# Patient Record
Sex: Female | Born: 1940
Health system: Southern US, Community
[De-identification: ages and names within clinical notes are randomized; demographics above are authoritative.]

## PROBLEM LIST (undated history)

## (undated) DIAGNOSIS — E785 Hyperlipidemia, unspecified: Secondary | ICD-10-CM

## (undated) DIAGNOSIS — K829 Disease of gallbladder, unspecified: Secondary | ICD-10-CM

## (undated) DIAGNOSIS — F419 Anxiety disorder, unspecified: Secondary | ICD-10-CM

## (undated) DIAGNOSIS — K5790 Diverticulosis of intestine, part unspecified, without perforation or abscess without bleeding: Secondary | ICD-10-CM

## (undated) DIAGNOSIS — B269 Mumps without complication: Secondary | ICD-10-CM

## (undated) DIAGNOSIS — M1990 Unspecified osteoarthritis, unspecified site: Secondary | ICD-10-CM

## (undated) DIAGNOSIS — Z9889 Other specified postprocedural states: Secondary | ICD-10-CM

## (undated) DIAGNOSIS — K7689 Other specified diseases of liver: Secondary | ICD-10-CM

## (undated) DIAGNOSIS — Z8619 Personal history of other infectious and parasitic diseases: Secondary | ICD-10-CM

## (undated) DIAGNOSIS — N281 Cyst of kidney, acquired: Secondary | ICD-10-CM

## (undated) DIAGNOSIS — R112 Nausea with vomiting, unspecified: Secondary | ICD-10-CM

## (undated) DIAGNOSIS — K635 Polyp of colon: Secondary | ICD-10-CM

## (undated) DIAGNOSIS — B059 Measles without complication: Secondary | ICD-10-CM

## (undated) DIAGNOSIS — S82852A Displaced trimalleolar fracture of left lower leg, initial encounter for closed fracture: Secondary | ICD-10-CM

## (undated) DIAGNOSIS — M75100 Unspecified rotator cuff tear or rupture of unspecified shoulder, not specified as traumatic: Secondary | ICD-10-CM

## (undated) DIAGNOSIS — B029 Zoster without complications: Secondary | ICD-10-CM

## (undated) DIAGNOSIS — K219 Gastro-esophageal reflux disease without esophagitis: Secondary | ICD-10-CM

## (undated) DIAGNOSIS — E039 Hypothyroidism, unspecified: Secondary | ICD-10-CM

## (undated) HISTORY — DX: Polyp of colon: K63.5

## (undated) HISTORY — DX: Hypothyroidism, unspecified: E03.9

## (undated) HISTORY — PX: TONSILLECTOMY: SUR1361

## (undated) HISTORY — DX: Hyperlipidemia, unspecified: E78.5

## (undated) HISTORY — DX: Diverticulosis of intestine, part unspecified, without perforation or abscess without bleeding: K57.90

## (undated) HISTORY — DX: Other specified diseases of liver: K76.89

## (undated) HISTORY — DX: Cyst of kidney, acquired: N28.1

## (undated) HISTORY — DX: Disease of gallbladder, unspecified: K82.9

## (undated) HISTORY — PX: TONSILLECTOMY: SHX5217

## (undated) HISTORY — DX: Gastro-esophageal reflux disease without esophagitis: K21.9

---

## 1997-11-30 ENCOUNTER — Ambulatory Visit (HOSPITAL_COMMUNITY): Admission: RE | Admit: 1997-11-30 | Discharge: 1997-11-30 | Payer: Self-pay | Admitting: Obstetrics & Gynecology

## 1998-12-04 ENCOUNTER — Encounter: Payer: Self-pay | Admitting: Obstetrics & Gynecology

## 1998-12-04 ENCOUNTER — Ambulatory Visit (HOSPITAL_COMMUNITY): Admission: RE | Admit: 1998-12-04 | Discharge: 1998-12-04 | Payer: Self-pay | Admitting: Obstetrics & Gynecology

## 1999-05-01 ENCOUNTER — Other Ambulatory Visit: Admission: RE | Admit: 1999-05-01 | Discharge: 1999-05-01 | Payer: Self-pay | Admitting: Obstetrics & Gynecology

## 1999-05-06 HISTORY — PX: ANTERIOR CRUCIATE LIGAMENT REPAIR: SHX115

## 1999-08-29 ENCOUNTER — Ambulatory Visit (HOSPITAL_COMMUNITY): Admission: RE | Admit: 1999-08-29 | Discharge: 1999-08-29 | Payer: Self-pay | Admitting: Specialist

## 1999-08-29 ENCOUNTER — Encounter: Payer: Self-pay | Admitting: Specialist

## 1999-09-20 ENCOUNTER — Encounter: Payer: Self-pay | Admitting: Specialist

## 1999-09-20 ENCOUNTER — Ambulatory Visit (HOSPITAL_COMMUNITY): Admission: RE | Admit: 1999-09-20 | Discharge: 1999-09-20 | Payer: Self-pay | Admitting: Specialist

## 1999-12-06 ENCOUNTER — Encounter: Payer: Self-pay | Admitting: Obstetrics & Gynecology

## 1999-12-06 ENCOUNTER — Ambulatory Visit (HOSPITAL_COMMUNITY): Admission: RE | Admit: 1999-12-06 | Discharge: 1999-12-06 | Payer: Self-pay | Admitting: Obstetrics & Gynecology

## 2000-06-02 ENCOUNTER — Other Ambulatory Visit: Admission: RE | Admit: 2000-06-02 | Discharge: 2000-06-02 | Payer: Self-pay | Admitting: Obstetrics & Gynecology

## 2000-12-21 ENCOUNTER — Ambulatory Visit (HOSPITAL_COMMUNITY): Admission: RE | Admit: 2000-12-21 | Discharge: 2000-12-21 | Payer: Self-pay | Admitting: Obstetrics & Gynecology

## 2000-12-21 ENCOUNTER — Encounter: Payer: Self-pay | Admitting: Obstetrics & Gynecology

## 2001-08-09 ENCOUNTER — Other Ambulatory Visit: Admission: RE | Admit: 2001-08-09 | Discharge: 2001-08-09 | Payer: Self-pay | Admitting: Obstetrics & Gynecology

## 2001-12-31 ENCOUNTER — Ambulatory Visit (HOSPITAL_COMMUNITY): Admission: RE | Admit: 2001-12-31 | Discharge: 2001-12-31 | Payer: Self-pay | Admitting: Obstetrics & Gynecology

## 2001-12-31 ENCOUNTER — Encounter: Payer: Self-pay | Admitting: Obstetrics & Gynecology

## 2002-04-29 ENCOUNTER — Ambulatory Visit (HOSPITAL_COMMUNITY): Admission: AD | Admit: 2002-04-29 | Discharge: 2002-04-29 | Payer: Self-pay | Admitting: Family Medicine

## 2002-04-29 ENCOUNTER — Encounter: Payer: Self-pay | Admitting: Family Medicine

## 2002-10-10 ENCOUNTER — Other Ambulatory Visit: Admission: RE | Admit: 2002-10-10 | Discharge: 2002-10-10 | Payer: Self-pay | Admitting: Obstetrics & Gynecology

## 2003-02-02 ENCOUNTER — Encounter: Payer: Self-pay | Admitting: Obstetrics & Gynecology

## 2003-02-02 ENCOUNTER — Ambulatory Visit (HOSPITAL_COMMUNITY): Admission: RE | Admit: 2003-02-02 | Discharge: 2003-02-02 | Payer: Self-pay | Admitting: Obstetrics & Gynecology

## 2003-10-30 ENCOUNTER — Other Ambulatory Visit: Admission: RE | Admit: 2003-10-30 | Discharge: 2003-10-30 | Payer: Self-pay | Admitting: Obstetrics & Gynecology

## 2004-02-19 ENCOUNTER — Ambulatory Visit (HOSPITAL_COMMUNITY): Admission: RE | Admit: 2004-02-19 | Discharge: 2004-02-19 | Payer: Self-pay | Admitting: Obstetrics & Gynecology

## 2005-02-25 ENCOUNTER — Ambulatory Visit (HOSPITAL_COMMUNITY): Admission: RE | Admit: 2005-02-25 | Discharge: 2005-02-25 | Payer: Self-pay | Admitting: Obstetrics & Gynecology

## 2005-03-25 ENCOUNTER — Ambulatory Visit (HOSPITAL_COMMUNITY): Admission: RE | Admit: 2005-03-25 | Discharge: 2005-03-25 | Payer: Self-pay | Admitting: Emergency Medicine

## 2005-05-23 ENCOUNTER — Other Ambulatory Visit: Admission: RE | Admit: 2005-05-23 | Discharge: 2005-05-23 | Payer: Self-pay | Admitting: Obstetrics & Gynecology

## 2006-02-26 ENCOUNTER — Ambulatory Visit (HOSPITAL_COMMUNITY): Admission: RE | Admit: 2006-02-26 | Discharge: 2006-02-26 | Payer: Self-pay | Admitting: Obstetrics & Gynecology

## 2006-03-31 ENCOUNTER — Ambulatory Visit (HOSPITAL_COMMUNITY): Admission: RE | Admit: 2006-03-31 | Discharge: 2006-03-31 | Payer: Self-pay | Admitting: Gastroenterology

## 2006-03-31 ENCOUNTER — Encounter (INDEPENDENT_AMBULATORY_CARE_PROVIDER_SITE_OTHER): Payer: Self-pay | Admitting: *Deleted

## 2006-08-04 ENCOUNTER — Ambulatory Visit (HOSPITAL_COMMUNITY): Admission: RE | Admit: 2006-08-04 | Discharge: 2006-08-04 | Payer: Self-pay | Admitting: Obstetrics & Gynecology

## 2007-03-04 ENCOUNTER — Ambulatory Visit (HOSPITAL_COMMUNITY): Admission: RE | Admit: 2007-03-04 | Discharge: 2007-03-04 | Payer: Self-pay | Admitting: Obstetrics & Gynecology

## 2007-05-06 HISTORY — PX: ANKLE FRACTURE SURGERY: SHX122

## 2007-05-25 ENCOUNTER — Ambulatory Visit (HOSPITAL_BASED_OUTPATIENT_CLINIC_OR_DEPARTMENT_OTHER): Admission: RE | Admit: 2007-05-25 | Discharge: 2007-05-26 | Payer: Self-pay | Admitting: Orthopedic Surgery

## 2007-07-13 ENCOUNTER — Encounter: Admission: RE | Admit: 2007-07-13 | Discharge: 2007-08-16 | Payer: Self-pay | Admitting: Orthopedic Surgery

## 2007-08-02 ENCOUNTER — Inpatient Hospital Stay (HOSPITAL_COMMUNITY): Admission: EM | Admit: 2007-08-02 | Discharge: 2007-08-04 | Payer: Self-pay | Admitting: Emergency Medicine

## 2008-01-27 ENCOUNTER — Emergency Department (HOSPITAL_COMMUNITY): Admission: EM | Admit: 2008-01-27 | Discharge: 2008-01-27 | Payer: Self-pay | Admitting: Emergency Medicine

## 2008-01-28 ENCOUNTER — Encounter (INDEPENDENT_AMBULATORY_CARE_PROVIDER_SITE_OTHER): Payer: Self-pay | Admitting: Surgery

## 2008-01-28 ENCOUNTER — Ambulatory Visit (HOSPITAL_COMMUNITY): Admission: EM | Admit: 2008-01-28 | Discharge: 2008-01-29 | Payer: Self-pay | Admitting: Emergency Medicine

## 2008-01-28 ENCOUNTER — Encounter: Payer: Self-pay | Admitting: Family Medicine

## 2008-03-29 ENCOUNTER — Ambulatory Visit (HOSPITAL_COMMUNITY): Admission: RE | Admit: 2008-03-29 | Discharge: 2008-03-29 | Payer: Self-pay | Admitting: Obstetrics & Gynecology

## 2008-05-05 HISTORY — PX: APPENDECTOMY: SHX54

## 2009-04-02 ENCOUNTER — Ambulatory Visit (HOSPITAL_COMMUNITY): Admission: RE | Admit: 2009-04-02 | Discharge: 2009-04-02 | Payer: Self-pay | Admitting: Obstetrics & Gynecology

## 2009-05-03 ENCOUNTER — Ambulatory Visit (HOSPITAL_COMMUNITY): Admission: RE | Admit: 2009-05-03 | Discharge: 2009-05-03 | Payer: Self-pay | Admitting: Gastroenterology

## 2010-03-07 ENCOUNTER — Ambulatory Visit (HOSPITAL_COMMUNITY): Admission: RE | Admit: 2010-03-07 | Discharge: 2010-03-07 | Payer: Self-pay | Admitting: Family Medicine

## 2010-04-15 ENCOUNTER — Ambulatory Visit (HOSPITAL_COMMUNITY)
Admission: RE | Admit: 2010-04-15 | Discharge: 2010-04-15 | Payer: Self-pay | Source: Home / Self Care | Attending: Obstetrics & Gynecology | Admitting: Obstetrics & Gynecology

## 2010-05-26 ENCOUNTER — Encounter: Payer: Self-pay | Admitting: Obstetrics & Gynecology

## 2010-09-17 NOTE — H&P (Signed)
NAME:  Kimberly Johns, Kimberly Johns NO.:  [id number]   MEDICAL RECORD NO.:  [id number]          PATIENT TYPE:  INP   LOCATION:  1524                         FACILITY:  [facility]   PHYSICIAN:  Sandria Bales. Ezzard Standing, M.D.  DATE OF BIRTH:  July 28, 1940   DATE OF ADMISSION:  01/28/2008  DATE OF DISCHARGE:                              HISTORY & PHYSICAL   HISTORY OF PRESENT ILLNESS:  This is a 70 year old white female who  works as a Dentist at [employer], is a patient of Carolin Coy at [facility] developed abdominal pain  earlier in the morning on the January 26, 2008.   She apparently went to the [facility] and was thought  to have some kind of possible gastrointestinal bug, was given Cipro and  metron but did not get any improvement.  Today, she saw Dr. Gerri Spore,  who obtained a CT scan at [facility].   The CT scan according to the patient was reviewed by Dr. Myles Rosenthal, who  told she had appendicitis.  She was then sent to [facility]  Room.   She has no history of peptic ulcer disease, liver disease, pancreatic  disease, colon disease.  She has had no prior abdominal surgery or  chronic GI complaints.   PAST MEDICAL HISTORY:  She is allergic to VANCOMYCIN, which gives her  red man syndrome.  She had that in January 2009.   CURRENT MEDICATIONS:  1. Aspirin 81 mg daily.  2. Premarin 0.675 mg daily.  3. Provera 0.5 mg daily.   REVIEW OF SYSTEMS:  NEUROLOGIC:  No seizures or loss of consciousness.  PULMONARY:  Does not smoke cigarettes.  No history of pneumonia or  tuberculosis.  CARDIAC:  He has had no heart disease, chest pain, or hypertension.  She  has never seen a cardiologist.  GASTROINTESTINAL:  See history of present illness.  UROLOGIC:  No kidney stones or kidney infections.  GYN:  She has never  been pregnant or had children.  She is married and her husband is  diabetic.  He is going home but he  is going to come back, I think, for  the surgery.   SOCIAL HISTORY:  She works as a Designer, industrial/product at [employer].   PHYSICAL EXAMINATION:  VITALS:  Her temperature is 98, pulse 93,  respirations 18, blood pressure 110/56.  HEENT:  Unremarkable.  NECK:  Supple.  No masses or thyromegaly.  LUNGS:  Clear to auscultation with symmetric breath sounds.  HEART:  Regular rate and rhythm.  I hear no murmur or rub.  BREASTS:  I did not examine her breast, but she states that she had  negative mammograms.  ABDOMEN:  Soft.  She has active bowel sounds, but she has some mild  guarding and tenderness in her suprapubic and right lower quadrant area.  I felt no mass or hernia.  EXTREMITIES:  She has good strength in all 4 extremities.  NEUROLOGIC:  Grossly intact.   LABORATORY DATA:  The only labs I have back is awhite blood count  of  9200.   I then reviewed her CT scan, which shows thickened appendix going down  to the right pelvis consistent with acute appendicitis.   DIAGNOSIS:  Acute appendicitis.   I discussed with the patient laparoscopic surgery to remove the appendix  and the benefits and risks.  Risk include bleeding, the need for open  surgery, possibility and need for bowel resection and the  hospitalization depending on the severity of appendicitis.      Sandria Bales. Ezzard Standing, M.D.  Electronically Signed     DHN/MEDQ  D:  01/28/2008  T:  01/29/2008  Job:  [id number]   cc:   Otilio Connors. Gerri Spore, M.D.

## 2010-09-17 NOTE — Op Note (Signed)
NAME:  Kimberly Johns, GOLLA NO.:  [id number]   MEDICAL RECORD NO.:  [id number]          PATIENT TYPE:  AMB   LOCATION:  DSC                          FACILITY:  [facility]   PHYSICIAN:  Robert A. Thurston Hole, M.D. DATE OF BIRTH:  02-26-1941   DATE OF PROCEDURE:  05/25/2007  DATE OF DISCHARGE:                               OPERATIVE REPORT   PREOPERATIVE DIAGNOSIS:  Left ankle trimalleolar fracture.   POSTOPERATIVE DIAGNOSIS:  Left ankle trimalleolar fracture.   PROCEDURE:  Open reduction/internal fixation of left ankle trimalleolar  fracture.   SURGEON:  Elana Alm. Thurston Hole, M.D.   ASSISTANT:  Julien Girt, P.A.   ANESTHESIA:  General.   OPERATIVE TIME:  One hour.   TOURNIQUET TIME:  Forty-five minutes.   COMPLICATIONS:  None.   INDICATIONS FOR PROCEDURE:  Ms. Kimberly Johns is a 70 year old woman who  sustained an ankle fracture/[place].  Underwent provisional reduction in the emergency room in [place] and  came to see [place] yesterday, where x-rays and examination were consistent  with a displaced trimalleolar ankle fracture/subluxation.  She is now to  undergo ORIF of this.   DESCRIPTION:  Kimberly Johns was brought to the operating room on May 25, 2007 after a popliteal block was placed in the holding room by  anesthesia.  She received Ancef 1 gm IV preoperatively for prophylaxis.  She was placed on the operative table in a supine position.  After being  placed under general anesthesia, her left foot and leg was prepped using  sterile Betadine and draped using a sterile technique.  The foot and leg  was exsanguinated, and a calf tourniquet elevated to 275 mm.  Initially,  through a 5 cm longitudinal incision based over the distal fibula,  initial exposure was made.  The in-line subcutaneous tissues were  incised along the skin incision.  The peroneal tendons and sural nerve  carefully retracted while the fracture was exposed.  Hematoma  was  removed from around the fracture site.  The fracture was then reduced in  an anatomic position and held there with a clamp while an anterior to  posterior lag screw, a 4.0 mm screw was placed, provisionally holding  the fracture in a reduced and anatomic position.  A 7-hole one-third  fibular plate was then placed on the lateral fibula with the two most  distal drill holes drilled, measured, and tapped, and the appropriate  length 4.0 mm screws placed, and the three most proximal screw holes  drilled, measured, and tapped, and the appropriate length of 3.5 mm  cortical screws placed.  Intraoperative fluoroscopy confirmed  satisfactory reduction of the fibular fracture and satisfactory position  of the hardware.   At this point, attention was turned to the medial malleolus fracture.  A  3 cm medial incision was made.  Underlying subcutaneous tissues were  incised in line with the skin incision.  The saphenous vein carefully  retracted while the fracture was exposed.  Hematoma was removed from  around the fracture site, and then the fracture was held in a reduced  and anatomic position while two guide pins were placed from the  cannulated 4.0 screw set across the fracture site.  Intraoperative  fluoroscopy confirmed satisfactory position of these pins, satisfactory  reduction of the medial malleolus fracture.  Each of these were then  measured and then over-drilled with a 2.7 mm drill and then two  separated 40 x 4.0 mm cannulated screws were placed, holding the  fracture in a reduced an anatomic position.  Intraoperative fluoroscopy  confirmed satisfactory reduction of the fracture.  The mortis was stable  and anatomic under stress x-rays.  Satisfactory position was noted of  the hardware.   At this point, the wounds were irrigated.  The wounds were then closed  with 2-0 Vicryl, 0 Vicryl, and skin staples.  Sterile dressings were  applied and a short leg splint.  The tourniquet was  released, and the  patient was awakened and taken to the recovery room in stable condition.   Needle and sponge counts were correct x2 at the end of the case.   FOLLOW-UP CARE:  Kimberly Johns will be followed overnight for observation  and IV pain control, discharged tomorrow on Percocet and Robaxin.  See  me back in the office in a week for wound check and followup.      Robert A. Thurston Hole, M.D.  Electronically Signed     RAW/MEDQ  D:  05/25/2007  T:  05/25/2007  Job:  [id number]

## 2010-09-17 NOTE — Op Note (Signed)
NAME:  Kimberly Johns, CRILLY NO.:  [id number]   MEDICAL RECORD NO.:  [id number]          PATIENT TYPE:  INP   LOCATION:  1524                         [facility]ACILITY[facility]   PHYSICIAN:  Sandria Bales. Ezzard Standing, M.D.  DATE OF BIRTH:  Aug 20, 1940   DATE OF PROCEDURE:  01/28/2008  DATE OF DISCHARGE:  01/29/2008                               OPERATIVE REPORT   Date of Surgery ??   PREOPERATIVE DIAGNOSIS:  Appendicitis.   POSTOPERATIVE DIAGNOSIS:  Acute gangrenous purulent appendicitis.   PROCEDURE:  Laparoscopic appendectomy.   SURGEON:  Sandria Bales. Ezzard Standing, M.D.   FIRST ASSISTANT:  None.   ANESTHESIA:  General endotracheal with 50 mL of 0.25% Marcaine.   ESTIMATED BLOOD LOSS:  Minimal.   INDICATIONS FOR PROCEDURE:  Kimberly Johns is a 70 year old white female  patient of Dr. Gerri Spore who has had a 2 day history of increasing  abdominal pain, went to the [facility] urgent care first and was put on  antibiotics but because of worsening symptoms had a CT scan obtained  today which was consistent with acute appendicitis.   I discussed with her the indications and potential complications of  appendiceal surgery to try to do this laparoscopically and the risks  include bleeding, open surgery, the need for bowel resection and the  length of hospitalization dictated by the degree of severity of her  appendicitis.   PROCEDURE IN DETAIL:  The patient was placed in supine position with a  left arm tourniquet out to her side and a Foley catheter in place and  given a gram of cefoxitin at initiation of procedure.  Her abdomen was  prepped with Betadine solution and sterilely draped.  A time-out was  taken to identify the patient and the procedure.   An infraumbilical incision was made with sharp dissection and carried  down to the abdominal cavity.  A zero degree 10 mm laparoscope was  inserted through a 12 mm Hasson trocar.  Three additional trocars were  placed, a 5 mm right upper quadrant and  a 10 mm left lower quadrant and  abdominal exploration carried out.  The right and left lobes of the  liver were unremarkable.  Gall Bladder was unremarkable.  Stomach  unremarkable.  I then directed attention to the lower abdomen and in the  right lower quadrant she had a purulent inflammatory mass.  When I  dissected this out the appendix was sort of stuck along the right pelvic  sidewall.  Maybe some of the fallopian tube was stuck to it.  She had a  completely gangrenous purulent gangrenous appendix.  I dissected the  mesentery of the appendix down to the base of the appendix.  I then used  the vascular load of the EndoGIA-45 endostapler and fired this across  the base of the appendix.  Actually at the very base I thought her  tissues looked reasonably good.  I then placed the appendix in the  EndoCatch bag and delivered it through the umbilicus.   A photo was taken of the appendix to put in the chart.  The abdomen  was  irrigated with about a liter and a half of saline.  I reinspected the  staple line.  There was no bleeding from the mesentery and the staple  line looked good.  I tried to irrigate any kind of purulence that was  left behind.  I then suctioned all this out.  I closed the umbilical  port with a 0 Vicryl suture.  I closed the skin at each port with a 5-0  Vicryl suture and painted each with tincture of benzoin and Steri-  Strips.  The patient tolerated the procedure well.   Sponge and needle count were correct again at the end of the case and  the patient was transported to the recovery room in good condition.      Sandria Bales. Ezzard Standing, M.D.  Electronically Signed     DHN/MEDQ  D:  01/28/2008  T:  01/29/2008  Job:  [id number]   cc:   Otilio Connors. Gerri Spore, M.D.  Fax: [phone]

## 2010-09-20 NOTE — Discharge Summary (Signed)
NAME:  MAR, ZETTLER NO.:  [id number]   MEDICAL RECORD NO.:  [id number]          PATIENT TYPE:  INP   LOCATION:  5021                         FACILITY:  [facility]   PHYSICIAN:  Elana Alm. Thurston Hole, M.D. DATE OF BIRTH:  [birth year]   DATE OF ADMISSION:  08/02/2007  DATE OF DISCHARGE:  08/04/2007                               DISCHARGE SUMMARY   ADMITTING DIAGNOSIS:  Left ankle cellulitis  s/p ORIF left ankle fracture.   HISTORY OF PRESENT ILLNESS:  The patient is a 70 year old white female,  who is 2 months status post ORIF left ankle.  She has developed acute  cellulitis in her left lower extremity and has failed conservative care  on clindamycin, Keflex, Rocephin, and Levaquin.  She was admitted for IV  antibiotics.   PROCEDURES IN-HOUSE:  Placement of a PICC line.   HOSPITAL COURSE:  On postop day 1, the patient was admitted.  She was  placed on IV vancomycin.  It was dosed according to pharmacy with strict  bedrest and elevation.  On postop day 1, she had no white cell count.  Her hemoglobin was stable.  She was afebrile.  Her ankle swelling and  cellulitis was improving.  On hospital day 2, redness had significantly  subsided.  She remained afebrile throughout her hospital course.  Her  PICC line was placed on hospital day 2.  On hospital day 3, she was  discharged to home in stable condition, weightbearing as tolerated, on  IV vancomycin, Premarin, Provera, and low-dose aspirin.  She had been  instructed to elevate.  She will follow up in our office in 1 week for a  lower extremity check.      Kirstin Shepperson, P.A.      Robert A. Thurston Hole, M.D.  Electronically Signed    KS/MEDQ  D:  08/30/2007  T:  08/31/2007  Job:  [id number]

## 2010-10-29 ENCOUNTER — Other Ambulatory Visit (HOSPITAL_COMMUNITY): Payer: Self-pay | Admitting: Family Medicine

## 2010-10-29 DIAGNOSIS — E049 Nontoxic goiter, unspecified: Secondary | ICD-10-CM

## 2010-10-31 ENCOUNTER — Other Ambulatory Visit (HOSPITAL_COMMUNITY): Payer: Self-pay | Admitting: Family Medicine

## 2010-10-31 DIAGNOSIS — R221 Localized swelling, mass and lump, neck: Secondary | ICD-10-CM

## 2010-11-12 ENCOUNTER — Ambulatory Visit (HOSPITAL_COMMUNITY)
Admission: RE | Admit: 2010-11-12 | Discharge: 2010-11-12 | Disposition: A | Discharge status: Home / Self Care | Payer: 59 | Source: Ambulatory Visit | Attending: Family Medicine | Admitting: Family Medicine

## 2010-11-12 DIAGNOSIS — E049 Nontoxic goiter, unspecified: Secondary | ICD-10-CM

## 2010-11-12 DIAGNOSIS — E039 Hypothyroidism, unspecified: Secondary | ICD-10-CM | POA: Insufficient documentation

## 2010-11-19 ENCOUNTER — Ambulatory Visit (HOSPITAL_COMMUNITY): Payer: Commercial Managed Care - PPO

## 2011-01-24 LAB — POCT HEMOGLOBIN-HEMACUE: Hemoglobin: 13.4

## 2011-01-27 LAB — COMPREHENSIVE METABOLIC PANEL
ALT: 22
AST: 21
Albumin: 3.6
Alkaline Phosphatase: 83
BUN: 23
CO2: 28
Calcium: 9.3
Chloride: 102
Creatinine, Ser: 0.74
GFR calc Af Amer: >60
GFR calc non Af Amer: >60
Glucose, Bld: 89
Potassium: 3.7
Sodium: 138
Total Bilirubin: 0.3
Total Protein: 7.2

## 2011-01-27 LAB — APTT: aPTT: 29

## 2011-01-27 LAB — DIFFERENTIAL
Basophils Absolute: 0
Basophils Relative: 1
Eosinophils Absolute: 0.1
Eosinophils Relative: 2
Lymphocytes Relative: 36
Lymphs Abs: 2.3
Monocytes Absolute: 0.6
Monocytes Relative: 10
Neutro Abs: 3.3
Neutrophils Relative %: 52

## 2011-01-27 LAB — C-REACTIVE PROTEIN: CRP: 0.8 — ABNORMAL HIGH (ref <0.6)

## 2011-01-27 LAB — CBC
HCT: 38.5
Hemoglobin: 13.4
MCHC: 34.7
MCV: 94.8
Platelets: 368
RBC: 4.07
RDW: 13.6
WBC: 6.4

## 2011-01-27 LAB — SEDIMENTATION RATE: Sed Rate: 22

## 2011-01-27 LAB — PROTIME-INR
INR: 1
Prothrombin Time: 12.9

## 2011-02-03 LAB — DIFFERENTIAL
Basophils Absolute: 0
Basophils Relative: 0
Eosinophils Absolute: 0
Eosinophils Relative: 0
Lymphocytes Relative: 9 — ABNORMAL LOW
Lymphocytes Relative: 9 — ABNORMAL LOW
Lymphs Abs: 0.8
Lymphs Abs: 1.1
Monocytes Absolute: 0.6
Monocytes Absolute: 0.7
Monocytes Relative: 5
Monocytes Relative: 8
Neutro Abs: 10.3 — ABNORMAL HIGH
Neutro Abs: 7.5
Neutrophils Relative %: 86 — ABNORMAL HIGH

## 2011-02-03 LAB — POCT I-STAT, CHEM 8
BUN: 11
Calcium, Ion: 1.14
Chloride: 106
Creatinine, Ser: 0.9
Glucose, Bld: 126 — ABNORMAL HIGH
HCT: 42
Hemoglobin: 14.3
Potassium: 4.1
Sodium: 137
TCO2: 26

## 2011-02-03 LAB — POCT URINALYSIS DIP (DEVICE)
Bilirubin Urine: NEGATIVE
Glucose, UA: NEGATIVE
Ketones, ur: NEGATIVE
Nitrite: NEGATIVE
Operator id: 239701
Protein, ur: 30 — AB
Specific Gravity, Urine: 1.01
Urobilinogen, UA: 0.2
pH: 6.5

## 2011-02-03 LAB — CBC
HCT: 38.7
HCT: 40.2
Hemoglobin: 14
MCHC: 34.8
MCV: 96.2
MCV: 97.2
Platelets: 202
Platelets: 211
RBC: 4.17
RDW: 14.1
RDW: 14.1
WBC: 12 — ABNORMAL HIGH

## 2011-02-03 LAB — COMPREHENSIVE METABOLIC PANEL
Albumin: 3.4 — ABNORMAL LOW
BUN: 9
Creatinine, Ser: 0.8
Potassium: 3.4 — ABNORMAL LOW
Total Protein: 6.7

## 2011-02-03 LAB — AMYLASE: Amylase: 37

## 2011-03-10 ENCOUNTER — Other Ambulatory Visit (HOSPITAL_COMMUNITY): Payer: Self-pay | Admitting: Obstetrics & Gynecology

## 2011-03-10 DIAGNOSIS — Z1231 Encounter for screening mammogram for malignant neoplasm of breast: Secondary | ICD-10-CM

## 2011-04-22 ENCOUNTER — Ambulatory Visit (HOSPITAL_COMMUNITY)
Admission: RE | Admit: 2011-04-22 | Discharge: 2011-04-22 | Disposition: A | Discharge status: Home / Self Care | Payer: 59 | Source: Ambulatory Visit | Attending: Obstetrics & Gynecology | Admitting: Obstetrics & Gynecology

## 2011-04-22 DIAGNOSIS — Z1231 Encounter for screening mammogram for malignant neoplasm of breast: Secondary | ICD-10-CM | POA: Insufficient documentation

## 2012-05-25 ENCOUNTER — Other Ambulatory Visit (HOSPITAL_COMMUNITY): Payer: Self-pay | Admitting: Family Medicine

## 2012-05-25 DIAGNOSIS — Z0000 Encounter for general adult medical examination without abnormal findings: Secondary | ICD-10-CM

## 2012-05-25 DIAGNOSIS — Z1231 Encounter for screening mammogram for malignant neoplasm of breast: Secondary | ICD-10-CM

## 2012-06-08 ENCOUNTER — Ambulatory Visit (HOSPITAL_COMMUNITY)
Admission: RE | Admit: 2012-06-08 | Discharge: 2012-06-08 | Disposition: A | Discharge status: Home / Self Care | Payer: 59 | Source: Ambulatory Visit | Attending: Family Medicine | Admitting: Family Medicine

## 2012-06-08 DIAGNOSIS — Z1231 Encounter for screening mammogram for malignant neoplasm of breast: Secondary | ICD-10-CM | POA: Insufficient documentation

## 2012-06-11 ENCOUNTER — Other Ambulatory Visit (HOSPITAL_COMMUNITY): Payer: Self-pay | Admitting: Family Medicine

## 2012-06-11 DIAGNOSIS — N951 Menopausal and female climacteric states: Secondary | ICD-10-CM

## 2012-06-16 ENCOUNTER — Other Ambulatory Visit (HOSPITAL_COMMUNITY): Payer: Self-pay | Admitting: Family Medicine

## 2012-06-16 ENCOUNTER — Ambulatory Visit (HOSPITAL_COMMUNITY)
Admission: RE | Admit: 2012-06-16 | Discharge: 2012-06-16 | Disposition: A | Discharge status: Home / Self Care | Payer: 59 | Source: Ambulatory Visit | Attending: Family Medicine | Admitting: Family Medicine

## 2012-06-16 DIAGNOSIS — N951 Menopausal and female climacteric states: Secondary | ICD-10-CM

## 2012-06-16 DIAGNOSIS — Z13820 Encounter for screening for osteoporosis: Secondary | ICD-10-CM | POA: Insufficient documentation

## 2012-06-16 DIAGNOSIS — Z780 Asymptomatic menopausal state: Secondary | ICD-10-CM | POA: Insufficient documentation

## 2012-07-01 ENCOUNTER — Other Ambulatory Visit (HOSPITAL_COMMUNITY): Payer: Self-pay | Admitting: Family Medicine

## 2012-07-01 DIAGNOSIS — R1010 Upper abdominal pain, unspecified: Secondary | ICD-10-CM

## 2012-07-02 ENCOUNTER — Encounter: Payer: Self-pay | Admitting: Gastroenterology

## 2012-07-02 ENCOUNTER — Ambulatory Visit (HOSPITAL_COMMUNITY)
Admission: RE | Admit: 2012-07-02 | Discharge: 2012-07-02 | Disposition: A | Discharge status: Home / Self Care | Payer: 59 | Source: Ambulatory Visit | Attending: Family Medicine | Admitting: Family Medicine

## 2012-07-02 DIAGNOSIS — R1011 Right upper quadrant pain: Secondary | ICD-10-CM | POA: Insufficient documentation

## 2012-07-02 DIAGNOSIS — M549 Dorsalgia, unspecified: Secondary | ICD-10-CM | POA: Insufficient documentation

## 2012-07-02 DIAGNOSIS — R1010 Upper abdominal pain, unspecified: Secondary | ICD-10-CM

## 2012-07-02 DIAGNOSIS — K7689 Other specified diseases of liver: Secondary | ICD-10-CM | POA: Insufficient documentation

## 2012-07-21 ENCOUNTER — Ambulatory Visit (INDEPENDENT_AMBULATORY_CARE_PROVIDER_SITE_OTHER): Payer: 59 | Admitting: Gastroenterology

## 2012-07-21 ENCOUNTER — Encounter: Payer: Self-pay | Admitting: Gastroenterology

## 2012-07-21 VITALS — BP 140/90 | HR 76 | Ht 65.0 in | Wt 153.2 lb

## 2012-07-21 DIAGNOSIS — Z860100 Personal history of colon polyps, unspecified: Secondary | ICD-10-CM | POA: Insufficient documentation

## 2012-07-21 DIAGNOSIS — R1013 Epigastric pain: Secondary | ICD-10-CM

## 2012-07-21 DIAGNOSIS — K219 Gastro-esophageal reflux disease without esophagitis: Secondary | ICD-10-CM | POA: Insufficient documentation

## 2012-07-21 MED ORDER — HYOSCYAMINE SULFATE 0.125 MG SL SUBL: 0.2500 mg | SUBLINGUAL_TABLET | SUBLINGUAL | Status: DC | PRN

## 2012-07-21 NOTE — Assessment & Plan Note (Signed)
Patient has reflux symptoms that are partially improved with omeprazole. With long-standing reflux Barrett's esophagus should be ruled out.  Recommendations #1 patient was instructed to take omeprazole before breakfast rather than dinner #2 upper endoscopy

## 2012-07-21 NOTE — Progress Notes (Signed)
History of Present Illness: Pleasant 72 year old white female referred at the request of Dr. Clyde Canterbury for evaluation of abdominal pain and reflux. For the past year she's been having relatively frequent pyrosis. Since starting omeprazole symptoms have improved although they remain. She denies dysphagia. She's on no gastric irritants including nonsteroidals. She also complains of spontaneous and sometimes postprandial upper abdominal pain. Pain may radiate through to the back. She denies nausea.  Recent ultrasound, which I reviewed, demonstrated gallbladder adenomyomatosis and cholesterolosis, and a small simple hepatic cyst.  Patient has history of colon polyps. An adenomatous polyp was removed in 2007. Followup colonoscopy in 2010 was normal.    Past Medical History  Diagnosis Date  . Diverticulosis   . HLD (hyperlipidemia)   . Hypothyroidism   . GERD (gastroesophageal reflux disease)   . Colon polyps   . Gallbladder disease   . Kidney cysts   . Liver cyst    Past Surgical History  Procedure Laterality Date  . Appendectomy  2010  . Tonsillectomy      age 43  . Ankle fracture surgery Left 2009  . Anterior cruciate ligament repair Left 2001   family history includes Alzheimer's disease (age of onset: 64) in her mother. Current Outpatient Prescriptions  Medication Sig Dispense Refill  . cholecalciferol (VITAMIN D) 400 UNITS TABS Take 400 Units by mouth daily.      Marland Kitchen estrogen, conjugated,-medroxyprogesterone (PREMPRO) 0.3-1.5 MG per tablet Take 1 tablet by mouth daily.      Kandice Hams Chondr-MSM (GLUCOSAMINE-CHONDROITIN-MSM) [phone] MG TABS Take 1 tablet by mouth daily.      Marland Kitchen levothyroxine (SYNTHROID, LEVOTHROID) 88 MCG tablet Take 88 mcg by mouth daily.      . Multiple Vitamins-Minerals (WOMENS MULTIVITAMIN PLUS) TABS Take 1 tablet by mouth daily.      Marland Kitchen omeprazole (PRILOSEC) 40 MG capsule Take 40 mg by mouth daily.      . pravastatin (PRAVACHOL) 40 MG tablet Take 40  mg by mouth daily.      . Psyllium (METAMUCIL) 30.9 % POWD Take by mouth daily.      Marland Kitchen desoximetasone (TOPICORT) 0.05 % cream Apply topically as needed.       No current facility-administered medications for this visit.   Allergies as of 07/21/2012 - Review Complete 07/21/2012  Allergen Reaction Noted  . Vancomycin Rash 05/25/2012    reports that she has quit smoking. Her smoking use included Cigarettes. She smoked 0.00 packs per day. She has never used smokeless tobacco. She reports that she does not drink alcohol or use illicit drugs.     Review of Systems: Pertinent positive and negative review of systems were noted in the above HPI section. All other review of systems were otherwise negative.  Vital signs were reviewed in today's medical record Physical Exam: General: Well developed , well nourished, no acute distress Skin: anicteric Head: Normocephalic and atraumatic Eyes:  sclerae anicteric, EOMI Ears: Normal auditory acuity Mouth: No deformity or lesions Neck: Supple, no masses or thyromegaly Lungs: Clear throughout to auscultation Heart: Regular rate and rhythm; no murmurs, rubs or bruits Abdomen: Soft, non tender and non distended. No masses, hepatosplenomegaly or hernias noted. Normal Bowel sounds Rectal:deferred Musculoskeletal: Symmetrical with no gross deformities  Skin: No lesions on visible extremities Pulses:  Normal pulses noted Extremities: No clubbing, cyanosis, edema or deformities noted Neurological: Alert oriented x 4, grossly nonfocal Cervical Nodes:  No significant cervical adenopathy Inguinal Nodes: No significant inguinal adenopathy Psychological:  Alert and cooperative. Normal  mood and affect

## 2012-07-21 NOTE — Assessment & Plan Note (Signed)
Plan followup colonoscopy 2015 

## 2012-07-21 NOTE — Assessment & Plan Note (Addendum)
Patient has several month history of upper abdominal pain with occasional radiation to the back. Recent ultrasound negative for gallbladder stones and minimal wall thickening. Symptoms could be due to ulcer or nonulcer dyspepsia. Pain from chronic cholecystitis is also a consideration.  Recommendations #1 upper endoscopy #2 hyomax when necessary #3 if the patient is not improved with hyomax and endoscopy is nondiagnostic I will obtain a HIDA scan

## 2012-07-21 NOTE — Patient Instructions (Addendum)
You have been scheduled for an endoscopy with propofol. Please follow written instructions given to you at your visit today. If you use inhalers (even only as needed), please bring them with you on the day of your procedure. 

## 2012-08-06 ENCOUNTER — Encounter: Payer: Self-pay | Admitting: Gastroenterology

## 2012-08-06 ENCOUNTER — Ambulatory Visit (AMBULATORY_SURGERY_CENTER): Payer: 59 | Admitting: Gastroenterology

## 2012-08-06 VITALS — BP 123/74 | HR 61 | Temp 98.8°F | Resp 19 | Ht 65.0 in | Wt 153.0 lb

## 2012-08-06 DIAGNOSIS — K219 Gastro-esophageal reflux disease without esophagitis: Secondary | ICD-10-CM

## 2012-08-06 DIAGNOSIS — R1013 Epigastric pain: Secondary | ICD-10-CM

## 2012-08-06 MED ORDER — SODIUM CHLORIDE 0.9 % IV SOLN: 500.0000 mL | INTRAVENOUS | Status: DC

## 2012-08-06 NOTE — Op Note (Signed)
[facility] [address] [place], [address]   ENDOSCOPY PROCEDURE REPORT  PATIENT: Kimberly, Johns  MR#: [id number] BIRTHDATE: 1940/08/05 , 72  yrs. old GENDER: Female ENDOSCOPIST: Louis Meckel, MD REFERRED BY:  Carolin Coy, M.D. PROCEDURE DATE:  08/06/2012 PROCEDURE:  EGD, diagnostic ASA CLASS:     Class II INDICATIONS:  Epigastric pain. MEDICATIONS: MAC sedation, administered by CRNA and propofol (Diprivan) 100mg  IV TOPICAL ANESTHETIC:  DESCRIPTION OF PROCEDURE: After the risks benefits and alternatives of the procedure were thoroughly explained, informed consent was obtained.  The LB [id number]-H180 [id number] endoscope was introduced through the mouth and advanced to the third portion of the duodenum. Without limitations.  The instrument was slowly withdrawn as the mucosa was fully examined.      The upper, middle and distal third of the esophagus were carefully inspected and no abnormalities were noted.  The z-line was well seen at the GEJ.  The endoscope was pushed into the fundus which was normal including a retroflexed view.  The antrum, gastric body, first and second part of the duodenum were unremarkable. Retroflexed views revealed no abnormalities.     The scope was then withdrawn from the patient and the procedure completed.  COMPLICATIONS: There were no complications. ENDOSCOPIC IMPRESSION: Normal EGD  RECOMMENDATIONS: [hyomax as needed for abdominal pain Continue omeprazole Office visit in one month REPEAT EXAM:  eSigned:  Louis Meckel, MD 08/06/2012 3:39 PM   CC:

## 2012-08-06 NOTE — Progress Notes (Addendum)
Patient did not have preoperative order for IV antibiotic SSI prophylaxis. (G8918)  Patient did not experience any of the following events: a burn prior to discharge; a fall within the facility; wrong site/side/patient/procedure/implant event; or a hospital transfer or hospital admission upon discharge from the facility. (G8907)  

## 2012-08-06 NOTE — Progress Notes (Signed)
Stable to RR 

## 2012-08-06 NOTE — Patient Instructions (Addendum)

## 2012-08-09 ENCOUNTER — Telehealth: Payer: Self-pay | Admitting: *Deleted

## 2012-08-09 NOTE — Telephone Encounter (Signed)
  Follow up Call-  Call back number 08/06/2012  Post procedure Call Back phone  # [phone] hm  Permission to leave phone message Yes     Patient questions:  Do you have a fever, pain , or abdominal swelling? no Pain Score  0 *  Have you tolerated food without any problems? yes  Have you been able to return to your normal activities? yes  Do you have any questions about your discharge instructions: Diet   no Medications  no Follow up visit  no  Do you have questions or concerns about your Care? no  Actions: * If pain score is 4 or above: No action needed, pain <4.

## 2012-09-08 ENCOUNTER — Encounter: Payer: Self-pay | Admitting: Gastroenterology

## 2012-09-08 ENCOUNTER — Ambulatory Visit (INDEPENDENT_AMBULATORY_CARE_PROVIDER_SITE_OTHER): Payer: 59 | Admitting: Gastroenterology

## 2012-09-08 VITALS — BP 126/72 | HR 76 | Ht 65.0 in | Wt 154.0 lb

## 2012-09-08 DIAGNOSIS — R1013 Epigastric pain: Secondary | ICD-10-CM

## 2012-09-08 DIAGNOSIS — K219 Gastro-esophageal reflux disease without esophagitis: Secondary | ICD-10-CM

## 2012-09-08 NOTE — Assessment & Plan Note (Signed)
Symptoms have resolved, presumably related to find use of omeprazole. She was instructed to take hyoscyamine as needed.

## 2012-09-08 NOTE — Progress Notes (Signed)
History of Present Illness:  Mrs. Murtagh has returned following upper endoscopy. Exam is normal. On omeprazole every morning she is symptom-free. She no longer has any episodes of pain radiating to her back. She denies pyrosis. On only one occasion has she taken hyomax  which gave her relief.    Review of Systems: Pertinent positive and negative review of systems were noted in the above HPI section. All other review of systems were otherwise negative.    Current Medications, Allergies, Past Medical History, Past Surgical History, Family History and Social History were reviewed in [employer] electronic medical record  Vital signs were reviewed in today's medical record. Physical Exam: General: Well developed , well nourished, no acute distress

## 2012-09-08 NOTE — Patient Instructions (Addendum)
Follow up as needed

## 2012-09-08 NOTE — Assessment & Plan Note (Addendum)
Symptoms are well controlled with omeprazole taken in the mornings. Plan to continue with the same.

## 2012-12-08 ENCOUNTER — Other Ambulatory Visit: Payer: Self-pay

## 2013-03-10 ENCOUNTER — Other Ambulatory Visit: Payer: Self-pay

## 2013-06-09 ENCOUNTER — Other Ambulatory Visit (HOSPITAL_COMMUNITY): Payer: Self-pay | Admitting: Family Medicine

## 2013-06-09 DIAGNOSIS — Z1231 Encounter for screening mammogram for malignant neoplasm of breast: Secondary | ICD-10-CM

## 2013-06-20 ENCOUNTER — Ambulatory Visit (HOSPITAL_COMMUNITY): Payer: 59

## 2013-06-27 ENCOUNTER — Other Ambulatory Visit (HOSPITAL_COMMUNITY): Payer: Self-pay | Admitting: Family Medicine

## 2013-06-27 ENCOUNTER — Ambulatory Visit (HOSPITAL_COMMUNITY)
Admission: RE | Admit: 2013-06-27 | Discharge: 2013-06-27 | Disposition: A | Discharge status: Home / Self Care | Payer: 59 | Source: Ambulatory Visit | Attending: Family Medicine | Admitting: Family Medicine

## 2013-06-27 DIAGNOSIS — Z1231 Encounter for screening mammogram for malignant neoplasm of breast: Secondary | ICD-10-CM | POA: Insufficient documentation

## 2013-07-08 ENCOUNTER — Other Ambulatory Visit: Payer: Self-pay | Admitting: Gastroenterology

## 2014-01-24 ENCOUNTER — Other Ambulatory Visit (HOSPITAL_COMMUNITY): Payer: Self-pay | Admitting: Sports Medicine

## 2014-01-24 DIAGNOSIS — M545 Low back pain, unspecified: Secondary | ICD-10-CM

## 2014-02-07 ENCOUNTER — Ambulatory Visit (HOSPITAL_COMMUNITY): Payer: 59

## 2014-02-07 ENCOUNTER — Ambulatory Visit (HOSPITAL_COMMUNITY)
Admission: RE | Admit: 2014-02-07 | Discharge: 2014-02-07 | Disposition: A | Discharge status: Home / Self Care | Payer: 59 | Source: Ambulatory Visit | Attending: Sports Medicine | Admitting: Sports Medicine

## 2014-02-07 DIAGNOSIS — M545 Low back pain, unspecified: Secondary | ICD-10-CM

## 2014-02-07 DIAGNOSIS — M79604 Pain in right leg: Secondary | ICD-10-CM | POA: Insufficient documentation

## 2014-02-07 DIAGNOSIS — R200 Anesthesia of skin: Secondary | ICD-10-CM | POA: Insufficient documentation

## 2014-06-06 ENCOUNTER — Other Ambulatory Visit (HOSPITAL_COMMUNITY): Payer: Self-pay | Admitting: Family Medicine

## 2014-06-06 DIAGNOSIS — E2839 Other primary ovarian failure: Secondary | ICD-10-CM

## 2014-06-14 ENCOUNTER — Ambulatory Visit (HOSPITAL_COMMUNITY)
Admission: RE | Admit: 2014-06-14 | Discharge: 2014-06-14 | Disposition: A | Discharge status: Home / Self Care | Payer: 59 | Source: Ambulatory Visit | Attending: Family Medicine | Admitting: Family Medicine

## 2014-06-14 DIAGNOSIS — E2839 Other primary ovarian failure: Secondary | ICD-10-CM | POA: Insufficient documentation

## 2014-07-25 ENCOUNTER — Other Ambulatory Visit (HOSPITAL_COMMUNITY): Payer: Self-pay | Admitting: Family Medicine

## 2014-07-25 DIAGNOSIS — Z1231 Encounter for screening mammogram for malignant neoplasm of breast: Secondary | ICD-10-CM

## 2014-07-27 ENCOUNTER — Ambulatory Visit (HOSPITAL_COMMUNITY)
Admission: RE | Admit: 2014-07-27 | Discharge: 2014-07-27 | Disposition: A | Discharge status: Home / Self Care | Payer: 59 | Source: Ambulatory Visit | Attending: Family Medicine | Admitting: Family Medicine

## 2014-07-27 DIAGNOSIS — Z1231 Encounter for screening mammogram for malignant neoplasm of breast: Secondary | ICD-10-CM | POA: Insufficient documentation

## 2014-10-05 ENCOUNTER — Other Ambulatory Visit: Payer: Self-pay | Admitting: Gastroenterology

## 2014-10-23 ENCOUNTER — Encounter (HOSPITAL_COMMUNITY): Payer: Self-pay | Admitting: *Deleted

## 2014-10-24 ENCOUNTER — Other Ambulatory Visit: Payer: Self-pay | Admitting: Gastroenterology

## 2014-10-30 ENCOUNTER — Ambulatory Visit (HOSPITAL_COMMUNITY): Payer: 59 | Admitting: Certified Registered Nurse Anesthetist

## 2014-10-30 ENCOUNTER — Encounter (HOSPITAL_COMMUNITY): Payer: Self-pay | Admitting: *Deleted

## 2014-10-30 ENCOUNTER — Ambulatory Visit (HOSPITAL_COMMUNITY)
Admission: RE | Admit: 2014-10-30 | Discharge: 2014-10-30 | Disposition: A | Discharge status: Home / Self Care | Payer: 59 | Source: Ambulatory Visit | Attending: Gastroenterology | Admitting: Gastroenterology

## 2014-10-30 ENCOUNTER — Encounter (HOSPITAL_COMMUNITY)
Admission: RE | Disposition: A | Discharge status: Home / Self Care | Payer: Self-pay | Source: Ambulatory Visit | Attending: Gastroenterology

## 2014-10-30 DIAGNOSIS — K5730 Diverticulosis of large intestine without perforation or abscess without bleeding: Secondary | ICD-10-CM | POA: Diagnosis not present

## 2014-10-30 DIAGNOSIS — Z86010 Personal history of colonic polyps: Secondary | ICD-10-CM | POA: Diagnosis not present

## 2014-10-30 DIAGNOSIS — K219 Gastro-esophageal reflux disease without esophagitis: Secondary | ICD-10-CM | POA: Insufficient documentation

## 2014-10-30 DIAGNOSIS — Z79890 Hormone replacement therapy (postmenopausal): Secondary | ICD-10-CM | POA: Diagnosis not present

## 2014-10-30 DIAGNOSIS — Z87891 Personal history of nicotine dependence: Secondary | ICD-10-CM | POA: Insufficient documentation

## 2014-10-30 DIAGNOSIS — E039 Hypothyroidism, unspecified: Secondary | ICD-10-CM | POA: Insufficient documentation

## 2014-10-30 DIAGNOSIS — Z09 Encounter for follow-up examination after completed treatment for conditions other than malignant neoplasm: Secondary | ICD-10-CM | POA: Diagnosis present

## 2014-10-30 HISTORY — PX: COLONOSCOPY WITH PROPOFOL: SHX5780

## 2014-10-30 SURGERY — COLONOSCOPY WITH PROPOFOL
Anesthesia: Monitor Anesthesia Care

## 2014-10-30 MED ORDER — LACTATED RINGERS IV SOLN
INTRAVENOUS | Status: DC
  Administered 2014-10-30: 08:00:00 via INTRAVENOUS

## 2014-10-30 MED ORDER — PROPOFOL 10 MG/ML IV BOLUS
INTRAVENOUS | Status: AC
  Filled 2014-10-30: qty 20

## 2014-10-30 MED ORDER — LIDOCAINE HCL (CARDIAC) 20 MG/ML IV SOLN
INTRAVENOUS | Status: AC
  Filled 2014-10-30: qty 5

## 2014-10-30 MED ORDER — SODIUM CHLORIDE 0.9 % IV SOLN: INTRAVENOUS | Status: DC

## 2014-10-30 MED ORDER — PROPOFOL 500 MG/50ML IV EMUL
INTRAVENOUS | Status: DC | PRN
  Administered 2014-10-30: 75 mg via INTRAVENOUS
  Administered 2014-10-30 (×2): 50 mg via INTRAVENOUS
  Administered 2014-10-30: 25 mg via INTRAVENOUS
  Administered 2014-10-30 (×3): 50 mg via INTRAVENOUS

## 2014-10-30 SURGICAL SUPPLY — 21 items

## 2014-10-30 NOTE — Anesthesia Preprocedure Evaluation (Signed)
Anesthesia Evaluation  Patient identified by MRN, date of birth, ID band Patient awake    Reviewed: Allergy & Precautions, H&P , NPO status , Patient's Chart, lab work & pertinent test results  Airway Mallampati: II  TM Distance: >3 FB Neck ROM: full    Dental  (+) Caps, Dental Advisory Given Veneers and caps front teeth:   Pulmonary neg pulmonary ROS, former smoker,  breath sounds clear to auscultation  Pulmonary exam normal       Cardiovascular Exercise Tolerance: Good negative cardio ROS Normal cardiovascular examRhythm:regular Rate:Normal     Neuro/Psych negative neurological ROS  negative psych ROS   GI/Hepatic negative GI ROS, Neg liver ROS, GERD-  Medicated and Controlled,  Endo/Other  negative endocrine ROSHypothyroidism   Renal/GU negative Renal ROS  negative genitourinary   Musculoskeletal   Abdominal   Peds  Hematology negative hematology ROS (+)   Anesthesia Other Findings   Reproductive/Obstetrics negative OB ROS                             Anesthesia Physical Anesthesia Plan  ASA: II  Anesthesia Plan: MAC   Post-op Pain Management:    Induction:   Airway Management Planned:   Additional Equipment:   Intra-op Plan:   Post-operative Plan:   Informed Consent: I have reviewed the patients History and Physical, chart, labs and discussed the procedure including the risks, benefits and alternatives for the proposed anesthesia with the patient or authorized representative who has indicated his/her understanding and acceptance.   Dental Advisory Given  Plan Discussed with: CRNA and Surgeon  Anesthesia Plan Comments:         Anesthesia Quick Evaluation

## 2014-10-30 NOTE — H&P (Signed)
  Procedure: Surveillance colonoscopy. 05/03/2009 normal surveillance colonoscopy performed. 03/31/2006 colonoscopy performed with removal of a 5 mm adenomatous sigmoid colon polyp  History: The patient is a 74 year old female born May 28, 1940. She is scheduled to undergo a surveillance colonoscopy today.  Past medical history: Back and hip surgery. Tonsillectomy. Left knee surgery. Appendectomy. Left ankle fracture surgery with metal plates. Hormone replacement therapy. Hypothyroidism. Hypercholesterolemia.  Medication allergies: Vancomycin causes rash  Exam: The patient is alert and lying comfortably on the endoscopy stretcher. Abdomen is soft and nontender to palpation. Lungs are clear to auscultation. Cardiac exam reveals a regular rhythm.  Plan: Proceed with surveillance colonoscopy

## 2014-10-30 NOTE — Transfer of Care (Signed)
Immediate Anesthesia Transfer of Care Note  Patient: Kimberly Johns  Procedure(s) Performed: Procedure(s): COLONOSCOPY WITH PROPOFOL (N/A)  Patient Location: PACU  Anesthesia Type:MAC  Level of Consciousness: awake, alert  and oriented  Airway & Oxygen Therapy: Patient Spontanous Breathing and Patient connected to face mask oxygen  Post-op Assessment: Report given to RN and Post -op Vital signs reviewed and stable  Post vital signs: Reviewed and stable  Last Vitals:  Filed Vitals:   10/30/14 0740  BP: 131/65  Pulse: 56  Temp: 36.4 C  Resp: 16    Complications: No apparent anesthesia complications

## 2014-10-30 NOTE — Discharge Instructions (Signed)

## 2014-10-30 NOTE — Op Note (Signed)
Procedure: Surveillance colonoscopy. 03/31/2006 colonoscopy performed with removal of a 5 mm adenomatous sigmoid colon polyp. 05/03/2009 normal surveillance colonoscopy  Endoscopist: Danise Edge  Premedication: Propofol administered by anesthesia  Procedure: The patient was placed in the left lateral decubitus position. Anal inspection and digital rectal exam were normal. The Pentax pediatric colonoscope was introduced into the rectum and advanced to the cecum. A normal-appearing appendiceal orifice and ileocecal valve were identified. Colonic preparation for the exam today was satisfactory. Withdrawal time was 16 minutes  Rectum. Normal. Retroflexed view of the distal rectum was normal  Sigmoid colon and descending colon. Left colonic diverticulosis  Splenic flexure. Normal  Transverse colon. Normal  Hepatic flexure. Normal  Ascending colon. Normal  Cecum and ileocecal valve. Normal  Assessment: Normal surveillance colonoscopy

## 2014-10-30 NOTE — Anesthesia Postprocedure Evaluation (Signed)
  Anesthesia Post-op Note  Patient: Kimberly Johns  Procedure(s) Performed: Procedure(s) (LRB): COLONOSCOPY WITH PROPOFOL (N/A)  Patient Location: PACU  Anesthesia Type: MAC  Level of Consciousness: awake and alert   Airway and Oxygen Therapy: Patient Spontanous Breathing  Post-op Pain: mild  Post-op Assessment: Post-op Vital signs reviewed, Patient's Cardiovascular Status Stable, Respiratory Function Stable, Patent Airway and No signs of Nausea or vomiting  Last Vitals:  Filed Vitals:   10/30/14 1020  BP: 140/88  Pulse: 65  Temp:   Resp: 25    Post-op Vital Signs: stable   Complications: No apparent anesthesia complications

## 2014-10-31 ENCOUNTER — Encounter (HOSPITAL_COMMUNITY): Payer: Self-pay | Admitting: Gastroenterology

## 2014-11-20 ENCOUNTER — Other Ambulatory Visit (HOSPITAL_COMMUNITY): Payer: Self-pay | Admitting: Orthopaedic Surgery

## 2014-12-01 ENCOUNTER — Encounter (HOSPITAL_COMMUNITY): Payer: Self-pay

## 2014-12-01 ENCOUNTER — Encounter (HOSPITAL_COMMUNITY)
Admission: RE | Admit: 2014-12-01 | Discharge: 2014-12-01 | Disposition: A | Discharge status: Home / Self Care | Payer: 59 | Source: Ambulatory Visit | Attending: Orthopaedic Surgery | Admitting: Orthopaedic Surgery

## 2014-12-01 DIAGNOSIS — M1611 Unilateral primary osteoarthritis, right hip: Secondary | ICD-10-CM | POA: Insufficient documentation

## 2014-12-01 DIAGNOSIS — K219 Gastro-esophageal reflux disease without esophagitis: Secondary | ICD-10-CM | POA: Insufficient documentation

## 2014-12-01 DIAGNOSIS — Z01812 Encounter for preprocedural laboratory examination: Secondary | ICD-10-CM | POA: Insufficient documentation

## 2014-12-01 DIAGNOSIS — Z79899 Other long term (current) drug therapy: Secondary | ICD-10-CM | POA: Diagnosis not present

## 2014-12-01 HISTORY — DX: Zoster without complications: B02.9

## 2014-12-01 HISTORY — DX: Unspecified osteoarthritis, unspecified site: M19.90

## 2014-12-01 HISTORY — DX: Personal history of other infectious and parasitic diseases: Z86.19

## 2014-12-01 HISTORY — DX: Measles without complication: B05.9

## 2014-12-01 HISTORY — DX: Displaced trimalleolar fracture of left lower leg, initial encounter for closed fracture: S82.852A

## 2014-12-01 HISTORY — DX: Mumps without complication: B26.9

## 2014-12-01 LAB — CBC
HCT: 36.3 % (ref 36.0–46.0)
Hemoglobin: 12 g/dL (ref 12.0–15.0)
MCH: 32.3 pg (ref 26.0–34.0)
MCHC: 33.1 g/dL (ref 30.0–36.0)
MCV: 97.6 fL (ref 78.0–100.0)
Platelets: 215 10*3/uL (ref 150–400)
RBC: 3.72 MIL/uL — ABNORMAL LOW (ref 3.87–5.11)
RDW: 14.3 % (ref 11.5–15.5)
WBC: 4.7 10*3/uL (ref 4.0–10.5)

## 2014-12-01 LAB — BASIC METABOLIC PANEL
Anion gap: 8 (ref 5–15)
BUN: 19 mg/dL (ref 6–20)
CO2: 26 mmol/L (ref 22–32)
CREATININE: 0.77 mg/dL (ref 0.44–1.00)
Calcium: 9.5 mg/dL (ref 8.9–10.3)
Chloride: 104 mmol/L (ref 101–111)
GFR calc non Af Amer: >60 mL/min (ref >60)
Glucose, Bld: 97 mg/dL (ref 65–99)
POTASSIUM: 4.5 mmol/L (ref 3.5–5.1)
SODIUM: 138 mmol/L (ref 135–145)

## 2014-12-01 LAB — ABO/RH: ABO/RH(D): O POS

## 2014-12-01 LAB — SURGICAL PCR SCREEN
MRSA, PCR: NEGATIVE
Staphylococcus aureus: POSITIVE — AB

## 2014-12-01 LAB — PROTIME-INR
INR: 0.97 (ref 0.00–1.49)
Prothrombin Time: 13.1 seconds (ref 11.6–15.2)

## 2014-12-01 LAB — APTT: APTT: 27 s (ref 24–37)

## 2014-12-01 NOTE — Progress Notes (Addendum)
Surgical screening results in epic per PAT visit 12/01/2014 positive for STAPH. Results sent to Dr Maureen Ralphs. Prescription for Mupriocin Ointment called to [facility] spoke with [place]. Pt is aware.

## 2014-12-01 NOTE — Patient Instructions (Signed)
ETHELDA DEANGELO  12/01/2014   Your procedure is scheduled on: Friday December 08, 2014   Report to [facility]  Entrance take [place]  elevators to 3rd floor to  Short Stay Center at 5:30 AM.  Call this number if you have problems the morning of surgery [phone]   Remember: ONLY 1 PERSON MAY GO WITH YOU TO SHORT STAY TO GET  READY MORNING OF YOUR SURGERY.  Do not eat food or drink liquids :After Midnight.     Take these medicines the morning of surgery with A SIP OF WATER: Levothyroxine; Omeprazole (Prilosec) oxycodone-acetaminophen if needed                               You may not have any metal on your body including hair pins and              piercings  Do not wear jewelry, make-up, lotions, powders or perfumes, deodorant             Do not wear nail polish.  Do not shave  48 hours prior to surgery.               Do not bring valuables to the hospital.  IS NOT             RESPONSIBLE   FOR VALUABLES.  Contacts, dentures or bridgework may not be worn into surgery.  Leave suitcase in the car. After surgery it may be brought to your room.                  Please read over the following fact sheets you were given:MRSA INFORMATION SHEET; BLOOD TRANSFUSION INFORMATION SHEET _____________________________________________________________________             [facility] - Preparing for Surgery Before surgery, you can play an important role.  Because skin is not sterile, your skin needs to be as free of germs as possible.  You can reduce the number of germs on your skin by washing with CHG (chlorahexidine gluconate) soap before surgery.  CHG is an antiseptic cleaner which kills germs and bonds with the skin to continue killing germs even after washing. Please DO NOT use if you have an allergy to CHG or antibacterial soaps.  If your skin becomes reddened/irritated stop using the CHG and inform your nurse when you arrive at Short Stay. Do not shave  (including legs and underarms) for at least 48 hours prior to the first CHG shower.  You may shave your face/neck. Please follow these instructions carefully:  1.  Shower with CHG Soap the night before surgery and the  morning of Surgery.  2.  If you choose to wash your hair, wash your hair first as usual with your  normal  shampoo.  3.  After you shampoo, rinse your hair and body thoroughly to remove the  shampoo.                           4.  Use CHG as you would any other liquid soap.  You can apply chg directly  to the skin and wash                       Gently with a scrungie or clean washcloth.  5.  Apply the CHG Soap to your body ONLY FROM THE NECK DOWN.   Do not use on face/ open                           Wound or open sores. Avoid contact with eyes, ears mouth and genitals (private parts).                       Wash face,  Genitals (private parts) with your normal soap.             6.  Wash thoroughly, paying special attention to the area where your surgery  will be performed.  7.  Thoroughly rinse your body with warm water from the neck down.  8.  DO NOT shower/wash with your normal soap after using and rinsing off  the CHG Soap.                9.  Pat yourself dry with a clean towel.            10.  Wear clean pajamas.            11.  Place clean sheets on your bed the night of your first shower and do not  sleep with pets. Day of Surgery : Do not apply any lotions/deodorants the morning of surgery.  Please wear clean clothes to the hospital/surgery center.  FAILURE TO FOLLOW THESE INSTRUCTIONS MAY RESULT IN THE CANCELLATION OF YOUR SURGERY PATIENT SIGNATURE_________________________________  NURSE SIGNATURE__________________________________  ________________________________________________________________________  WHAT IS A BLOOD TRANSFUSION? Blood Transfusion Information  A transfusion is the replacement of blood or some of its parts. Blood is made up of multiple cells which  provide different functions.  Red blood cells carry oxygen and are used for blood loss replacement.  White blood cells fight against infection.  Platelets control bleeding.  Plasma helps clot blood.  Other blood products are available for specialized needs, such as hemophilia or other clotting disorders. BEFORE THE TRANSFUSION  Who gives blood for transfusions?   Healthy volunteers who are fully evaluated to make sure their blood is safe. This is blood bank blood. Transfusion therapy is the safest it has ever been in the practice of medicine. Before blood is taken from a donor, a complete history is taken to make sure that person has no history of diseases nor engages in risky social behavior (examples are intravenous drug use or sexual activity with multiple partners). The donor's travel history is screened to minimize risk of transmitting infections, such as malaria. The donated blood is tested for signs of infectious diseases, such as HIV and hepatitis. The blood is then tested to be sure it is compatible with you in order to minimize the chance of a transfusion reaction. If you or a relative donates blood, this is often done in anticipation of surgery and is not appropriate for emergency situations. It takes many days to process the donated blood. RISKS AND COMPLICATIONS Although transfusion therapy is very safe and saves many lives, the main dangers of transfusion include:   Getting an infectious disease.  Developing a transfusion reaction. This is an allergic reaction to something in the blood you were given. Every precaution is taken to prevent this. The decision to have a blood transfusion has been considered carefully by your caregiver before blood is given. Blood is not given unless the benefits outweigh the risks. AFTER THE TRANSFUSION  Right after receiving a  blood transfusion, you will usually feel much better and more energetic. This is especially true if your red blood cells  have gotten low (anemic). The transfusion raises the level of the red blood cells which carry oxygen, and this usually causes an energy increase.  The nurse administering the transfusion will monitor you carefully for complications. HOME CARE INSTRUCTIONS  No special instructions are needed after a transfusion. You may find your energy is better. Speak with your caregiver about any limitations on activity for underlying diseases you may have. SEEK MEDICAL CARE IF:   Your condition is not improving after your transfusion.  You develop redness or irritation at the intravenous (IV) site. SEEK IMMEDIATE MEDICAL CARE IF:  Any of the following symptoms occur over the next 12 hours:  Shaking chills.  You have a temperature by mouth above 102 F (38.9 C), not controlled by medicine.  Chest, back, or muscle pain.  People around you feel you are not acting correctly or are confused.  Shortness of breath or difficulty breathing.  Dizziness and fainting.  You get a rash or develop hives.  You have a decrease in urine output.  Your urine turns a dark color or changes to pink, red, or brown. Any of the following symptoms occur over the next 10 days:  You have a temperature by mouth above 102 F (38.9 C), not controlled by medicine.  Shortness of breath.  Weakness after normal activity.  The white part of the eye turns yellow (jaundice).  You have a decrease in the amount of urine or are urinating less often.  Your urine turns a dark color or changes to pink, red, or brown. Document Released: 04/18/2000 Document Revised: 07/14/2011 Document Reviewed: 12/06/2007 [facility] [place] Patient Information 2014 [place], [place].  _______________________________________________________________________

## 2014-12-07 LAB — TYPE AND SCREEN
ABO/RH(D): O POS
Antibody Screen: NEGATIVE

## 2014-12-08 ENCOUNTER — Inpatient Hospital Stay (HOSPITAL_COMMUNITY): Payer: 59

## 2014-12-08 ENCOUNTER — Encounter (HOSPITAL_COMMUNITY)
Admission: RE | Disposition: A | Discharge status: Home Health | Payer: Self-pay | Source: Ambulatory Visit | Attending: Orthopaedic Surgery

## 2014-12-08 ENCOUNTER — Inpatient Hospital Stay (HOSPITAL_COMMUNITY)
Admission: RE | Admit: 2014-12-08 | Discharge: 2014-12-10 | DRG: 470 | Disposition: A | Discharge status: Home Health | Payer: 59 | Source: Ambulatory Visit | Attending: Orthopaedic Surgery | Admitting: Orthopaedic Surgery

## 2014-12-08 ENCOUNTER — Encounter (HOSPITAL_COMMUNITY): Payer: Self-pay | Admitting: *Deleted

## 2014-12-08 ENCOUNTER — Inpatient Hospital Stay (HOSPITAL_COMMUNITY): Payer: 59 | Admitting: Anesthesiology

## 2014-12-08 DIAGNOSIS — Z01812 Encounter for preprocedural laboratory examination: Secondary | ICD-10-CM

## 2014-12-08 DIAGNOSIS — E785 Hyperlipidemia, unspecified: Secondary | ICD-10-CM | POA: Diagnosis present

## 2014-12-08 DIAGNOSIS — Z87891 Personal history of nicotine dependence: Secondary | ICD-10-CM

## 2014-12-08 DIAGNOSIS — M25551 Pain in right hip: Secondary | ICD-10-CM | POA: Diagnosis present

## 2014-12-08 DIAGNOSIS — E039 Hypothyroidism, unspecified: Secondary | ICD-10-CM | POA: Diagnosis present

## 2014-12-08 DIAGNOSIS — K219 Gastro-esophageal reflux disease without esophagitis: Secondary | ICD-10-CM | POA: Diagnosis present

## 2014-12-08 DIAGNOSIS — Z96641 Presence of right artificial hip joint: Secondary | ICD-10-CM

## 2014-12-08 DIAGNOSIS — Z79899 Other long term (current) drug therapy: Secondary | ICD-10-CM

## 2014-12-08 DIAGNOSIS — D62 Acute posthemorrhagic anemia: Secondary | ICD-10-CM | POA: Diagnosis not present

## 2014-12-08 DIAGNOSIS — M1611 Unilateral primary osteoarthritis, right hip: Secondary | ICD-10-CM | POA: Diagnosis present

## 2014-12-08 DIAGNOSIS — Z09 Encounter for follow-up examination after completed treatment for conditions other than malignant neoplasm: Secondary | ICD-10-CM

## 2014-12-08 HISTORY — PX: TOTAL HIP ARTHROPLASTY: SHX124

## 2014-12-08 SURGERY — ARTHROPLASTY, HIP, TOTAL, ANTERIOR APPROACH
Anesthesia: General | Site: Hip | Laterality: Right

## 2014-12-08 MED ORDER — VITAMIN D3 25 MCG (1000 UNIT) PO TABS
5000.0000 [IU] | ORAL_TABLET | Freq: Every day | ORAL | Status: DC
  Administered 2014-12-09 – 2014-12-10 (×2): 5000 [IU] via ORAL
  Filled 2014-12-08 (×4): qty 5

## 2014-12-08 MED ORDER — PROPOFOL 10 MG/ML IV BOLUS
INTRAVENOUS | Status: AC
  Filled 2014-12-08: qty 20

## 2014-12-08 MED ORDER — FENTANYL CITRATE (PF) 100 MCG/2ML IJ SOLN
INTRAMUSCULAR | Status: AC
  Filled 2014-12-08: qty 4

## 2014-12-08 MED ORDER — ACETAMINOPHEN 650 MG RE SUPP: 650.0000 mg | Freq: Four times a day (QID) | RECTAL | Status: DC | PRN

## 2014-12-08 MED ORDER — MIDAZOLAM HCL 2 MG/2ML IJ SOLN
INTRAMUSCULAR | Status: AC
  Filled 2014-12-08: qty 4

## 2014-12-08 MED ORDER — ACETAMINOPHEN 325 MG PO TABS: 650.0000 mg | ORAL_TABLET | Freq: Four times a day (QID) | ORAL | Status: DC | PRN

## 2014-12-08 MED ORDER — CEFAZOLIN SODIUM 1-5 GM-% IV SOLN
1.0000 g | Freq: Four times a day (QID) | INTRAVENOUS | Status: AC
  Administered 2014-12-08 (×2): 1 g via INTRAVENOUS
  Filled 2014-12-08 (×3): qty 50

## 2014-12-08 MED ORDER — PRAVASTATIN SODIUM 40 MG PO TABS
40.0000 mg | ORAL_TABLET | Freq: Every day | ORAL | Status: DC
  Administered 2014-12-08 – 2014-12-09 (×2): 40 mg via ORAL
  Filled 2014-12-08 (×4): qty 1

## 2014-12-08 MED ORDER — CEFAZOLIN SODIUM-DEXTROSE 2-3 GM-% IV SOLR
2.0000 g | INTRAVENOUS | Status: AC
  Administered 2014-12-08: 2 g via INTRAVENOUS

## 2014-12-08 MED ORDER — ONDANSETRON HCL 4 MG/2ML IJ SOLN
INTRAMUSCULAR | Status: DC | PRN
  Administered 2014-12-08: 4 mg via INTRAVENOUS

## 2014-12-08 MED ORDER — PROMETHAZINE HCL 25 MG/ML IJ SOLN: 6.2500 mg | INTRAMUSCULAR | Status: DC | PRN

## 2014-12-08 MED ORDER — ADULT MULTIVITAMIN W/MINERALS CH
1.0000 | ORAL_TABLET | Freq: Every day | ORAL | Status: DC
  Administered 2014-12-09 (×2): 1 via ORAL
  Filled 2014-12-08 (×4): qty 1

## 2014-12-08 MED ORDER — POLYETHYLENE GLYCOL 3350 17 G PO PACK: 17.0000 g | PACK | Freq: Every day | ORAL | Status: DC | PRN

## 2014-12-08 MED ORDER — ZOLPIDEM TARTRATE 5 MG PO TABS: 5.0000 mg | ORAL_TABLET | Freq: Every evening | ORAL | Status: DC | PRN

## 2014-12-08 MED ORDER — ROCURONIUM BROMIDE 100 MG/10ML IV SOLN
INTRAVENOUS | Status: DC | PRN
  Administered 2014-12-08: 40 mg via INTRAVENOUS

## 2014-12-08 MED ORDER — MIDAZOLAM HCL 5 MG/5ML IJ SOLN
INTRAMUSCULAR | Status: DC | PRN
  Administered 2014-12-08: 2 mg via INTRAVENOUS

## 2014-12-08 MED ORDER — DOCUSATE SODIUM 100 MG PO CAPS
100.0000 mg | ORAL_CAPSULE | Freq: Two times a day (BID) | ORAL | Status: DC
  Administered 2014-12-08 – 2014-12-10 (×4): 100 mg via ORAL

## 2014-12-08 MED ORDER — FENTANYL CITRATE (PF) 100 MCG/2ML IJ SOLN
INTRAMUSCULAR | Status: AC
  Filled 2014-12-08: qty 2

## 2014-12-08 MED ORDER — PANTOPRAZOLE SODIUM 40 MG PO TBEC
80.0000 mg | DELAYED_RELEASE_TABLET | Freq: Every day | ORAL | Status: DC
  Administered 2014-12-09 – 2014-12-10 (×2): 80 mg via ORAL
  Filled 2014-12-08 (×2): qty 2

## 2014-12-08 MED ORDER — PROPOFOL 10 MG/ML IV BOLUS
INTRAVENOUS | Status: DC | PRN
  Administered 2014-12-08: 120 mg via INTRAVENOUS

## 2014-12-08 MED ORDER — HYDROMORPHONE HCL 1 MG/ML IJ SOLN
1.0000 mg | INTRAMUSCULAR | Status: DC | PRN
  Administered 2014-12-08 – 2014-12-09 (×4): 1 mg via INTRAVENOUS
  Filled 2014-12-08 (×4): qty 1

## 2014-12-08 MED ORDER — SODIUM CHLORIDE 0.9 % IV SOLN
INTRAVENOUS | Status: DC
  Administered 2014-12-08: 13:00:00 via INTRAVENOUS

## 2014-12-08 MED ORDER — METHOCARBAMOL 500 MG PO TABS
500.0000 mg | ORAL_TABLET | Freq: Four times a day (QID) | ORAL | Status: DC | PRN
Start: 2014-12-08 — End: 2014-12-10
  Administered 2014-12-09 – 2014-12-10 (×4): 500 mg via ORAL
  Filled 2014-12-08 (×4): qty 1

## 2014-12-08 MED ORDER — MENTHOL 3 MG MT LOZG: 1.0000 | LOZENGE | OROMUCOSAL | Status: DC | PRN

## 2014-12-08 MED ORDER — ONDANSETRON HCL 4 MG PO TABS
4.0000 mg | ORAL_TABLET | Freq: Four times a day (QID) | ORAL | Status: DC | PRN
  Administered 2014-12-10: 4 mg via ORAL
  Filled 2014-12-08: qty 1

## 2014-12-08 MED ORDER — TRANEXAMIC ACID 1000 MG/10ML IV SOLN
1000.0000 mg | INTRAVENOUS | Status: AC
  Administered 2014-12-08: 1000 mg via INTRAVENOUS
  Filled 2014-12-08: qty 10

## 2014-12-08 MED ORDER — ASPIRIN EC 325 MG PO TBEC
325.0000 mg | DELAYED_RELEASE_TABLET | Freq: Two times a day (BID) | ORAL | Status: DC
  Administered 2014-12-08 – 2014-12-10 (×4): 325 mg via ORAL
  Filled 2014-12-08 (×7): qty 1

## 2014-12-08 MED ORDER — LEVOTHYROXINE SODIUM 88 MCG PO TABS
88.0000 ug | ORAL_TABLET | Freq: Every day | ORAL | Status: DC
Start: 2014-12-08 — End: 2014-12-10
  Administered 2014-12-08 – 2014-12-10 (×3): 88 ug via ORAL
  Filled 2014-12-08 (×6): qty 1

## 2014-12-08 MED ORDER — METHOCARBAMOL 1000 MG/10ML IJ SOLN
500.0000 mg | Freq: Four times a day (QID) | INTRAMUSCULAR | Status: DC | PRN
Start: 2014-12-08 — End: 2014-12-10
  Administered 2014-12-08: 500 mg via INTRAVENOUS
  Filled 2014-12-08 (×2): qty 5

## 2014-12-08 MED ORDER — FENTANYL CITRATE (PF) 100 MCG/2ML IJ SOLN: 25.0000 ug | INTRAMUSCULAR | Status: DC | PRN

## 2014-12-08 MED ORDER — EPHEDRINE SULFATE 50 MG/ML IJ SOLN
INTRAMUSCULAR | Status: DC | PRN
  Administered 2014-12-08: 5 mg via INTRAVENOUS

## 2014-12-08 MED ORDER — OXYCODONE HCL 5 MG PO TABS
5.0000 mg | ORAL_TABLET | ORAL | Status: DC | PRN
  Administered 2014-12-08: 5 mg via ORAL
  Administered 2014-12-08 – 2014-12-09 (×3): 10 mg via ORAL
  Administered 2014-12-09 – 2014-12-10 (×7): 15 mg via ORAL
  Filled 2014-12-08: qty 3
  Filled 2014-12-08: qty 2
  Filled 2014-12-08 (×3): qty 3
  Filled 2014-12-08: qty 1
  Filled 2014-12-08: qty 3
  Filled 2014-12-08: qty 2
  Filled 2014-12-08: qty 3
  Filled 2014-12-08: qty 2
  Filled 2014-12-08: qty 3

## 2014-12-08 MED ORDER — LACTATED RINGERS IV SOLN: INTRAVENOUS | Status: DC

## 2014-12-08 MED ORDER — LACTATED RINGERS IV SOLN
INTRAVENOUS | Status: DC | PRN
  Administered 2014-12-08 (×2): via INTRAVENOUS

## 2014-12-08 MED ORDER — METOCLOPRAMIDE HCL 10 MG PO TABS: 5.0000 mg | ORAL_TABLET | Freq: Three times a day (TID) | ORAL | Status: DC | PRN

## 2014-12-08 MED ORDER — ALUM & MAG HYDROXIDE-SIMETH 200-200-20 MG/5ML PO SUSP: 30.0000 mL | ORAL | Status: DC | PRN

## 2014-12-08 MED ORDER — CONJ ESTROG-MEDROXYPROGEST ACE 0.3-1.5 MG PO TABS: 1.0000 | ORAL_TABLET | Freq: Every day | ORAL | Status: DC

## 2014-12-08 MED ORDER — LIDOCAINE HCL (CARDIAC) 20 MG/ML IV SOLN
INTRAVENOUS | Status: DC | PRN
  Administered 2014-12-08: 50 mg via INTRAVENOUS

## 2014-12-08 MED ORDER — NEOSTIGMINE METHYLSULFATE 10 MG/10ML IV SOLN
INTRAVENOUS | Status: DC | PRN
  Administered 2014-12-08: 3 mg via INTRAVENOUS

## 2014-12-08 MED ORDER — METOCLOPRAMIDE HCL 5 MG/ML IJ SOLN: 5.0000 mg | Freq: Three times a day (TID) | INTRAMUSCULAR | Status: DC | PRN

## 2014-12-08 MED ORDER — DIPHENHYDRAMINE HCL 12.5 MG/5ML PO ELIX: 12.5000 mg | ORAL_SOLUTION | ORAL | Status: DC | PRN

## 2014-12-08 MED ORDER — CEFAZOLIN SODIUM-DEXTROSE 2-3 GM-% IV SOLR
INTRAVENOUS | Status: AC
  Filled 2014-12-08: qty 50

## 2014-12-08 MED ORDER — FENTANYL CITRATE (PF) 100 MCG/2ML IJ SOLN
INTRAMUSCULAR | Status: DC | PRN
  Administered 2014-12-08 (×2): 50 ug via INTRAVENOUS
  Administered 2014-12-08 (×3): 100 ug via INTRAVENOUS

## 2014-12-08 MED ORDER — PHENOL 1.4 % MT LIQD: 1.0000 | OROMUCOSAL | Status: DC | PRN

## 2014-12-08 MED ORDER — CALCIUM CARBONATE-VITAMIN D 500-200 MG-UNIT PO TABS
1.0000 | ORAL_TABLET | Freq: Every day | ORAL | Status: DC
  Administered 2014-12-09 – 2014-12-10 (×2): 1 via ORAL
  Filled 2014-12-08 (×5): qty 1

## 2014-12-08 MED ORDER — GLYCOPYRROLATE 0.2 MG/ML IJ SOLN
INTRAMUSCULAR | Status: DC | PRN
  Administered 2014-12-08: 0.4 mg via INTRAVENOUS

## 2014-12-08 MED ORDER — FENTANYL CITRATE (PF) 100 MCG/2ML IJ SOLN
25.0000 ug | INTRAMUSCULAR | Status: DC | PRN
  Administered 2014-12-08 (×3): 50 ug via INTRAVENOUS

## 2014-12-08 MED ORDER — MEPERIDINE HCL 25 MG/ML IJ SOLN: 6.2500 mg | INTRAMUSCULAR | Status: DC | PRN

## 2014-12-08 MED ORDER — ONDANSETRON HCL 4 MG/2ML IJ SOLN: 4.0000 mg | Freq: Four times a day (QID) | INTRAMUSCULAR | Status: DC | PRN

## 2014-12-08 SURGICAL SUPPLY — 43 items
BAG ZIPLOCK 12X15 (MISCELLANEOUS) ×3 IMPLANT
BENZOIN TINCTURE PRP APPL 2/3 (GAUZE/BANDAGES/DRESSINGS) ×3 IMPLANT
BLADE SAW SGTL 18X1.27X75 (BLADE) ×2 IMPLANT
BLADE SAW SGTL 18X1.27X75MM (BLADE) ×1
CAPT HIP TOTAL 2 ×3 IMPLANT
CELLS DAT CNTRL 66122 CELL SVR (MISCELLANEOUS) ×1 IMPLANT
CLOSURE WOUND 1/2 X4 (GAUZE/BANDAGES/DRESSINGS) ×1
COVER PERINEAL POST (MISCELLANEOUS) ×3 IMPLANT
DRAPE C-ARM 42X120 X-RAY (DRAPES) ×3 IMPLANT
DRAPE STERI IOBAN 125X83 (DRAPES) ×3 IMPLANT
DRAPE U-SHAPE 47X51 STRL (DRAPES) ×9 IMPLANT
DRSG AQUACEL AG ADV 3.5X10 (GAUZE/BANDAGES/DRESSINGS) ×3 IMPLANT
DURAPREP 26ML APPLICATOR (WOUND CARE) ×3 IMPLANT
ELECT BLADE TIP CTD 4 INCH (ELECTRODE) ×3 IMPLANT
ELECT REM PT RETURN 9FT ADLT (ELECTROSURGICAL) ×3
ELECTRODE REM PT RTRN 9FT ADLT (ELECTROSURGICAL) ×1 IMPLANT
FACESHIELD WRAPAROUND (MASK) ×12 IMPLANT
GAUZE XEROFORM 1X8 LF (GAUZE/BANDAGES/DRESSINGS) ×3 IMPLANT
GLOVE BIO SURGEON STRL SZ7.5 (GLOVE) ×6 IMPLANT
GLOVE BIOGEL PI IND STRL 8 (GLOVE) ×2 IMPLANT
GLOVE BIOGEL PI INDICATOR 8 (GLOVE) ×4
GLOVE ECLIPSE 8.0 STRL XLNG CF (GLOVE) ×3 IMPLANT
GOWN STRL REUS W/TWL XL LVL3 (GOWN DISPOSABLE) ×6 IMPLANT
HANDPIECE INTERPULSE COAX TIP (DISPOSABLE) ×2
HEAD M SROM 36MM 2 (Hips) IMPLANT
KIT BASIN OR (CUSTOM PROCEDURE TRAY) ×3 IMPLANT
PACK TOTAL JOINT (CUSTOM PROCEDURE TRAY) ×3 IMPLANT
PEN SKIN MARKING BROAD (MISCELLANEOUS) ×3 IMPLANT
RTRCTR WOUND ALEXIS 18CM MED (MISCELLANEOUS) ×3
SET HNDPC FAN SPRY TIP SCT (DISPOSABLE) ×1 IMPLANT
SROM M HEAD 36MM 2 (Hips) IMPLANT
STAPLER VISISTAT 35W (STAPLE) IMPLANT
STRIP CLOSURE SKIN 1/2X4 (GAUZE/BANDAGES/DRESSINGS) ×2 IMPLANT
SUT ETHIBOND NAB CT1 #1 30IN (SUTURE) ×3 IMPLANT
SUT MNCRL AB 4-0 PS2 18 (SUTURE) ×3 IMPLANT
SUT VIC AB 0 CT1 36 (SUTURE) ×3 IMPLANT
SUT VIC AB 1 CT1 36 (SUTURE) ×3 IMPLANT
SUT VIC AB 2-0 CT1 27 (SUTURE) ×4
SUT VIC AB 2-0 CT1 TAPERPNT 27 (SUTURE) ×2 IMPLANT
TOWEL OR 17X26 10 PK STRL BLUE (TOWEL DISPOSABLE) ×3 IMPLANT
TOWEL OR NON WOVEN STRL DISP B (DISPOSABLE) ×3 IMPLANT
TRAY FOLEY W/METER SILVER 14FR (SET/KITS/TRAYS/PACK) ×3 IMPLANT
YANKAUER SUCT BULB TIP 10FT TU (MISCELLANEOUS) ×3 IMPLANT

## 2014-12-08 NOTE — Anesthesia Preprocedure Evaluation (Addendum)
Anesthesia Evaluation  Patient identified by MRN, date of birth, ID band Patient awake    Reviewed: Allergy & Precautions, NPO status , Patient's Chart, lab work & pertinent test results  Airway Mallampati: II  TM Distance: >3 FB Neck ROM: Full    Dental no notable dental hx. (+) Caps   Pulmonary neg pulmonary ROS, former smoker,  breath sounds clear to auscultation  Pulmonary exam normal       Cardiovascular negative cardio ROS Normal cardiovascular examRhythm:Regular Rate:Normal     Neuro/Psych negative neurological ROS  negative psych ROS   GI/Hepatic negative GI ROS, Neg liver ROS,   Endo/Other  negative endocrine ROS  Renal/GU negative Renal ROS  negative genitourinary   Musculoskeletal negative musculoskeletal ROS (+)   Abdominal   Peds negative pediatric ROS (+)  Hematology negative hematology ROS (+)   Anesthesia Other Findings   Reproductive/Obstetrics negative OB ROS                            Anesthesia Physical Anesthesia Plan  ASA: II  Anesthesia Plan: General   Post-op Pain Management:    Induction: Intravenous  Airway Management Planned: Oral ETT  Additional Equipment:   Intra-op Plan:   Post-operative Plan: Extubation in OR  Informed Consent: I have reviewed the patients History and Physical, chart, labs and discussed the procedure including the risks, benefits and alternatives for the proposed anesthesia with the patient or authorized representative who has indicated his/her understanding and acceptance.   Dental advisory given  Plan Discussed with: CRNA  Anesthesia Plan Comments:         Anesthesia Quick Evaluation

## 2014-12-08 NOTE — Transfer of Care (Signed)
Immediate Anesthesia Transfer of Care Note  Patient: Kimberly Johns  Procedure(s) Performed: Procedure(s): RIGHT TOTAL HIP ARTHROPLASTY ANTERIOR APPROACH (Right)  Patient Location: PACU  Anesthesia Type:General  Level of Consciousness: awake, alert  and oriented  Airway & Oxygen Therapy: Patient Spontanous Breathing and Patient connected to face mask oxygen  Post-op Assessment: Report given to RN and Post -op Vital signs reviewed and stable  Post vital signs: Reviewed and stable  Last Vitals:  Filed Vitals:   12/08/14 0535  BP: 122/63  Pulse: 67  Temp: 36.7 C  Resp: 18    Complications: No apparent anesthesia complications

## 2014-12-08 NOTE — Evaluation (Signed)
Physical Therapy Evaluation Patient Details Name: Kimberly Johns MRN: [id number] DOB: Mar 30, 1941 Today's Date: 12/08/2014   History of Present Illness  R THR  Clinical Impression  Pt s/p R THR presents with decreased R LE strength/ROM and post op pain limiting functional mobility.  Pt should progress well to dc home with family assist and HHPT follow up.    Follow Up Recommendations Home health PT    Equipment Recommendations  None recommended by PT    Recommendations for Other Services OT consult     Precautions / Restrictions Precautions Precautions: Fall Restrictions Weight Bearing Restrictions: No Other Position/Activity Restrictions: WBAT      Mobility  Bed Mobility Overal bed mobility: Needs Assistance Bed Mobility: Supine to Sit;Sit to Supine     Supine to sit: Min assist;Mod assist Sit to supine: Min assist;Mod assist   General bed mobility comments: cues for sequence and use of L LE to self assist  Transfers Overall transfer level: Needs assistance Equipment used: Rolling walker (2 wheeled) Transfers: Sit to/from Stand Sit to Stand: Min assist;Mod assist         General transfer comment: cues for LE management and use of UEs to self assist  Ambulation/Gait Ambulation/Gait assistance: Min assist;Mod assist Ambulation Distance (Feet): 40 Feet Assistive device: Rolling walker (2 wheeled) Gait Pattern/deviations: Step-to pattern;Decreased step length - right;Decreased step length - left;Shuffle;Trunk flexed     General Gait Details: cues for sequence, posture and position from [employer]            Wheelchair Mobility    Modified Rankin (Stroke Patients Only)       Balance                                             Pertinent Vitals/Pain Pain Assessment: 0-10 Pain Score: 5  Pain Location: R hip Pain Descriptors / Indicators: Aching;Sore Pain Intervention(s): Limited activity within patient's tolerance;Monitored  during session;Premedicated before session;RN gave pain meds during session;Ice applied    Home Living Family/patient expects to be discharged to:: Private residence Living Arrangements: Spouse/significant other Available Help at Discharge: Family Type of Home: House Home Access: Stairs to enter Entrance Stairs-Rails: None Entrance Stairs-Number of Steps: 3 Home Layout: One level Home Equipment: Environmental consultant - 2 wheels;Cane - single point      Prior Function Level of Independence: Needs assistance   Gait / Transfers Assistance Needed: cues for sequence, posture and position from RW           Hand Dominance        Extremity/Trunk Assessment   Upper Extremity Assessment: Overall WFL for tasks assessed           Lower Extremity Assessment: RLE deficits/detail      Cervical / Trunk Assessment: Normal  Communication   Communication: No difficulties  Cognition Arousal/Alertness: Awake/alert Behavior During Therapy: WFL for tasks assessed/performed Overall Cognitive Status: Within Functional Limits for tasks assessed                      General Comments      Exercises        Assessment/Plan    PT Assessment Patient needs continued PT services  PT Diagnosis Difficulty walking   PT Problem List Decreased strength;Decreased range of motion;Decreased activity tolerance;Decreased mobility;Decreased knowledge of use of DME;Pain  PT Treatment Interventions DME  instruction;Gait training;Stair training;Therapeutic exercise;Therapeutic activities;Functional mobility training;Patient/family education   PT Goals (Current goals can be found in the Care Plan section) Acute Rehab PT Goals Patient Stated Goal: Resume previous lifestyle with decreased pain PT Goal Formulation: With patient Time For Goal Achievement: 12/15/14 Potential to Achieve Goals: Good    Frequency 7X/week   Barriers to discharge        Co-evaluation               End of Session  Equipment Utilized During Treatment: Gait belt Activity Tolerance: Patient tolerated treatment well Patient left: in bed;with call bell/phone within reach;with family/visitor present Nurse Communication: Mobility status         Time: 1610-9604 PT Time Calculation (min) (ACUTE ONLY): 24 min   Charges:   PT Evaluation $Initial PT Evaluation Tier I: 1 Procedure PT Treatments $Gait Training: 8-22 mins   PT G Codes:        Kimberly Johns 12-30-14, 5:34 PM

## 2014-12-08 NOTE — Anesthesia Procedure Notes (Signed)
Procedure Name: Intubation Performed by: Kamen Hanken J Pre-anesthesia Checklist: Patient identified, Emergency Drugs available, Suction available, Timeout performed and Patient being monitored Patient Re-evaluated:Patient Re-evaluated prior to inductionOxygen Delivery Method: Circle system utilized Preoxygenation: Pre-oxygenation with 100% oxygen Intubation Type: IV induction Ventilation: Mask ventilation without difficulty Laryngoscope Size: Mac and 3 Grade View: Grade I Tube size: 7.0 mm Number of attempts: 1 Airway Equipment and Method: Stylet Placement Confirmation: ETT inserted through vocal cords under direct vision,  positive ETCO2,  CO2 detector and breath sounds checked- equal and bilateral Secured at: 21 cm Tube secured with: Tape Dental Injury: Teeth and Oropharynx as per pre-operative assessment      

## 2014-12-08 NOTE — Brief Op Note (Signed)
12/08/2014  8:59 AM  PATIENT:  Kimberly Johns  74 y.o. female  PRE-OPERATIVE DIAGNOSIS:  Painful primary osteoarthritis right hip  POST-OPERATIVE DIAGNOSIS:  Painful primary osteoarthritis right hip  PROCEDURE:  Procedure(s): RIGHT TOTAL HIP ARTHROPLASTY ANTERIOR APPROACH (Right)  SURGEON:  Surgeon(s) and Role:    * Kathryne Hitch, MD - Primary  PHYSICIAN ASSISTANT: Rexene Edison, PA-C  ANESTHESIA:   general  EBL:  Total I/O In: 1000 [I.V.:1000] Out: 500 [Urine:100; Blood:400]  BLOOD ADMINISTERED:none  DRAINS: none   LOCAL MEDICATIONS USED:  NONE  SPECIMEN:  No Specimen  DISPOSITION OF SPECIMEN:  N/A  COUNTS:  YES  TOURNIQUET:  * No tourniquets in log *  DICTATION: .Other Dictation: Dictation Number [id number]  PLAN OF CARE: Admit to inpatient   PATIENT DISPOSITION:  PACU - hemodynamically stable.   Delay start of Pharmacological VTE agent (>24hrs) due to surgical blood loss or risk of bleeding: no

## 2014-12-08 NOTE — Op Note (Signed)
NAME:  Kimberly Johns, Kimberly Johns NO.:  [id number]  MEDICAL RECORD NO.:  [id number]  LOCATION:  [facility]                         FACILITY:  [facility]  PHYSICIAN:  Vanita Panda. Magnus Ivan, M.D.DATE OF BIRTH:  Mar 28, 1941  DATE OF PROCEDURE:  12/08/2014 DATE OF DISCHARGE:                              OPERATIVE REPORT   PREOPERATIVE DIAGNOSIS:  Primary osteoarthritis and degenerative joint disease, right hip.  POSTOPERATIVE DIAGNOSIS:  Primary osteoarthritis and degenerative joint disease, right hip.  PROCEDURE:  Right total hip arthroplasty direct anterior approach.  IMPLANTS:  DePuy Sector Gription acetabular component size 52 with a single screw, size 36+ 0 neutral polyethylene liner, size 10 Corail femoral component with standard offset, size 36+ 1.5 ceramic hip ball.  SURGEON:  Vanita Panda. Magnus Ivan, MD  ASSISTANT:  Richardean Canal, PA-C  ANESTHESIA:  General.  ANTIBIOTICS:  2 g IV Ancef.  BLOOD LOSS:  400 mL.  COMPLICATIONS:  None.  INDICATIONS:  Ms. Bari is well known to me.  She is a 74 year old female with primary osteoarthritis and degenerative joint disease involving her right hip.  This has become debilitating painful to her. She has had a large effusion of the hip joint, her mobility, activities of daily living, and quality of life have been dramatically affected detrimentally due to this hip pain and arthritis.  X-rays show significant loss of her joint space on the right hip, sclerotic changes, and periarticular osteophytes.  With failure of conservative treatment, she wished to proceed with a total hip arthroplasty, direct anterior approach.  She understands the risks of acute blood loss, anemia, nerve and vessel injury, fracture, infection, dislocation, DVT.  She understands her goals are decreased pain, improved mobility, and overall improved quality of life.  PROCEDURE DESCRIPTION:  After informed consent was obtained, appropriate right hip was  marked.  She was brought to the operating room and general anesthesia was obtained while she was on her stretcher.  A Foley catheter was placed and then both feet had traction boots applied to them.  Next, she was placed supine on the Hana fracture table with perineal post in place and both legs in inline skeletal traction devices, but no traction applied.  Her right operative hip was prepped and draped with DuraPrep and sterile drapes.  Time-out was called to identify correct patient, correct right hip.  We then made an incision, inferior and posterior to the anterior superior iliac spine and carried this obliquely down the leg.  I dissected down to the tensor fascia lata muscle and then tensor fascia was divided longitudinally, so we could proceed with direct anterior approach to the hip.  I identified and cauterized the lateral femoral circumflex vessels and then identified the hip capsule.  I placed temporary Cobra retractor on lateral medial femoral neck and then opened up the hip capsule in an L-type format finding a very large joint effusion.  We then placed a Cobra retractor within the hip capsule and made our femoral neck cut with an oscillating saw proximal to the lesser trochanter and completed this with an osteotome.  I placed a corkscrew guide in the femoral head and removed the femoral head in it's entirety.  I  found her to be completely devoid of cartilage.  We then placed a bent Hohmann over the medial acetabular rim and released the transverse acetabular ligament.  I used a #10 blade to remove remnants of the labrum and other soft tissue from within the acetabulum.  Under direct visualization, we then began reaming from a size 43 reamer and 2 mm increments up to a size 52 with all reamers under direct visualization and a last reamer also under direct fluoroscopy, so we could obtain our depth reaming our inclination and anteversion.  Once we were pleased with this, we  placed the real DePuy Sector Gription acetabular component size 52, a single screw and a 36+ 0 neutral polyethylene liner for that size 52 acetabular component.  Attention was then turned to the femur.  With the leg externally rotated to 100 degrees extended and abducted, we were able to place a Mueller retractor medially and Hohmann retractor behind the greater trochanter and released the lateral joint capsule and used a box cutting osteotome to enter femoral canal and a rongeur to lateralize.  We then began broaching from a size 8 broach using the Corail broaching system up to a size 10.  With a size 10, we trialed a standard neck and a 36+ 1.5 hip ball, I rolled the leg back over and up, with traction and internal rotation reducing the pelvis, and we thought that she was a little bit long.  We then dislocated the hip and removed the trial components and we placed the real Corail femoral component size 10 and the real 36- 2 metal hip ball rolled leg back over and up, with traction and internal rotation, reducing the pelvis, but I was not pleased with her shuck in the level of looseness felt, so we dislocated the hip and removed that real hip ball and trialed a 36+ 1.5 hip ball again and felt like we got our leg length and offset more appropriate, tight.  We then dislocated the hip and removed the trial hip ball and placed the real 36+ 1.5 ceramic hip ball, reduced this in the acetabulum and I was pleased with offset, leg length and her stability on exam.  We then irrigated the soft tissues with normal saline solution using pulsatile lavage.  We were able to get a nice tight capsular closure with #1 Ethibond suture followed by #1 Vicryl in the tensor fascia, 0 Vicryl in the deep tissue, 2-0 Vicryl in subcutaneous tissue, 4-0 Monocryl subcuticular stitch. Aquacel dressing applied.  She was then taken off the Hana table, awakened, extubated, and taken to the recovery room in stable  condition. All final counts were correct.  There were no complications noted.  Of note, Richardean Canal, PA-C assisted in the entire case, since his assistance was crucial for facilitating all aspects of this case.     Vanita Panda. Magnus Ivan, M.D.     CYB/MEDQ  D:  12/08/2014  T:  12/08/2014  Job:  [id number]

## 2014-12-08 NOTE — Anesthesia Postprocedure Evaluation (Signed)
  Anesthesia Post-op Note  Patient: Kimberly Johns  Procedure(s) Performed: Procedure(s) (LRB): RIGHT TOTAL HIP ARTHROPLASTY ANTERIOR APPROACH (Right)  Patient Location: PACU  Anesthesia Type: General  Level of Consciousness: awake and alert   Airway and Oxygen Therapy: Patient Spontanous Breathing  Post-op Pain: mild  Post-op Assessment: Post-op Vital signs reviewed, Patient's Cardiovascular Status Stable, Respiratory Function Stable, Patent Airway and No signs of Nausea or vomiting  Last Vitals:  Filed Vitals:   12/08/14 1739  BP: 126/66  Pulse: 88  Temp: 36.5 C  Resp: 16    Post-op Vital Signs: stable   Complications: No apparent anesthesia complications

## 2014-12-08 NOTE — H&P (Signed)
TOTAL HIP ADMISSION H&P  Patient is admitted for right total hip arthroplasty.  Subjective:  Chief Complaint: right hip pain  HPI: Kimberly Johns, 74 y.o. female, has a history of pain and functional disability in the right hip(s) due to arthritis and patient has failed non-surgical conservative treatments for greater than 12 weeks to include NSAID's and/or analgesics, corticosteriod injections, flexibility and strengthening excercises and activity modification.  Onset of symptoms was gradual starting 2 years ago with rapidlly worsening course since that time.The patient noted no past surgery on the right hip(s).  Patient currently rates pain in the right hip at 10 out of 10 with activity. Patient has night pain, worsening of pain with activity and weight bearing, pain that interfers with activities of daily living and pain with passive range of motion. Patient has evidence of subchondral sclerosis, periarticular osteophytes and joint space narrowing by imaging studies. This condition presents safety issues increasing the risk of falls.  There is no current active infection.  Patient Active Problem List   Diagnosis Date Noted  . Osteoarthritis of right hip 12/08/2014  . Abdominal pain, epigastric 07/21/2012  . Esophageal reflux 07/21/2012  . Personal history of colonic polyps 07/21/2012   Past Medical History  Diagnosis Date  . Diverticulosis   . HLD (hyperlipidemia)   . GERD (gastroesophageal reflux disease)   . Colon polyps   . Gallbladder disease   . Liver cyst   . Kidney cysts   . Hypothyroidism   . Fracture of ankle, trimalleolar, left, closed   . Arthritis   . Shingles     hx of   . History of chicken pox   . Measles     hx of   . Mumps     hx of     Past Surgical History  Procedure Laterality Date  . Appendectomy  2010  . Tonsillectomy      age 65  . Ankle fracture surgery Left 2009  . Anterior cruciate ligament repair Left 2001  . Tonsillectomy    . Colonoscopy  with propofol N/A 10/30/2014    Procedure: COLONOSCOPY WITH PROPOFOL;  Surgeon: Charolett Bumpers, MD;  Location: [facility];  Service: Endoscopy;  Laterality: N/A;    Prescriptions prior to admission  Medication Sig Dispense Refill Last Dose  . calcium-vitamin D (OSCAL WITH D) 500-200 MG-UNIT per tablet Take 1 tablet by mouth daily with breakfast.   12/07/2014 at pm  . celecoxib (CELEBREX) 200 MG capsule Take 200 mg by mouth 2 (two) times daily.   Past Week at Unknown time  . Cholecalciferol (VITAMIN D3) 5000 UNITS CAPS Take 1 capsule by mouth daily.   12/07/2014 at Unknown time  . estrogen, conjugated,-medroxyprogesterone (PREMPRO) 0.3-1.5 MG per tablet Take 1 tablet by mouth at bedtime.   12/07/2014 at Unknown time  . Glucosa-Chondr-Na Chondr-MSM (GLUCOSAMINE-CHONDROITIN-MSM) [phone] MG TABS Take 1 tablet by mouth at bedtime.    11/30/2014  . levothyroxine (SYNTHROID, LEVOTHROID) 88 MCG tablet Take 88 mcg by mouth daily.   12/07/2014 at am  . Multiple Vitamins-Minerals (WOMENS MULTIVITAMIN PLUS) TABS Take 1 tablet by mouth at bedtime.    12/07/2014 at Unknown time  . omeprazole (PRILOSEC) 40 MG capsule Take 40 mg by mouth every morning.    12/08/2014 at 0400  . oxyCODONE-acetaminophen (PERCOCET/ROXICET) 5-325 MG per tablet Take 1 tablet by mouth every 4 (four) hours as needed for moderate pain or severe pain.   12/08/2014 at 0300  . pravastatin (PRAVACHOL) 40 MG  tablet Take 40 mg by mouth every evening.    12/07/2014 at pm  . Psyllium (METAMUCIL) 30.9 % POWD Take 1 packet by mouth at bedtime.    12/07/2014 at Unknown time   Allergies  Allergen Reactions  . Vancomycin Rash    History  Substance Use Topics  . Smoking status: Former Smoker -- 0.50 packs/day for 6 years    Types: Cigarettes    Quit date: 10/22/1968  . Smokeless tobacco: Never Used  . Alcohol Use: No    Family History  Problem Relation Age of Onset  . Alzheimer's disease Mother 12  . Colon cancer Neg Hx   . Esophageal cancer Neg  Hx   . Rectal cancer Neg Hx   . Stomach cancer Neg Hx      Review of Systems  Musculoskeletal: Positive for joint pain.  All other systems reviewed and are negative.   Objective:  Physical Exam  Constitutional: She is oriented to person, place, and time. She appears well-developed and well-nourished.  HENT:  Head: Normocephalic and atraumatic.  Eyes: EOM are normal. Pupils are equal, round, and reactive to light.  Neck: Normal range of motion. Neck supple.  Cardiovascular: Normal rate and regular rhythm.   Respiratory: Effort normal and breath sounds normal.  GI: Soft. Bowel sounds are normal.  Musculoskeletal:       Right hip: She exhibits decreased range of motion, decreased strength, tenderness and bony tenderness.  Neurological: She is alert and oriented to person, place, and time.  Skin: Skin is warm and dry.  Psychiatric: She has a normal mood and affect.    Vital signs in last 24 hours: Temp:  [98 F (36.7 C)] 98 F (36.7 C) (08/05 0535) Pulse Rate:  [67] 67 (08/05 0535) Resp:  [18] 18 (08/05 0535) BP: (122)/(63) 122/63 mmHg (08/05 0535) SpO2:  [97 %] 97 % (08/05 0535) Weight:  [65.318 kg (144 lb)] 65.318 kg (144 lb) (08/05 0553)  Labs:   Estimated body mass index is 23.96 kg/(m^2) as calculated from the following:   Height as of this encounter: 5\' 5"  (1.651 m).   Weight as of this encounter: 65.318 kg (144 lb).   Imaging Review Plain radiographs demonstrate severe degenerative joint disease of the right hip(s). The bone quality appears to be good for age and reported activity level.  Assessment/Plan:  End stage arthritis, right hip(s)  The patient history, physical examination, clinical judgement of the provider and imaging studies are consistent with end stage degenerative joint disease of the right hip(s) and total hip arthroplasty is deemed medically necessary. The treatment options including medical management, injection therapy, arthroscopy and  arthroplasty were discussed at length. The risks and benefits of total hip arthroplasty were presented and reviewed. The risks due to aseptic loosening, infection, stiffness, dislocation/subluxation,  thromboembolic complications and other imponderables were discussed.  The patient acknowledged the explanation, agreed to proceed with the plan and consent was signed. Patient is being admitted for inpatient treatment for surgery, pain control, PT, OT, prophylactic antibiotics, VTE prophylaxis, progressive ambulation and ADL's and discharge planning.The patient is planning to be discharged home with home health services

## 2014-12-08 NOTE — Progress Notes (Signed)
Pt has sm amt swelling rt upper lip.

## 2014-12-09 LAB — BASIC METABOLIC PANEL
Anion gap: 7 (ref 5–15)
BUN: 11 mg/dL (ref 6–20)
CO2: 26 mmol/L (ref 22–32)
Calcium: 8.6 mg/dL — ABNORMAL LOW (ref 8.9–10.3)
Chloride: 104 mmol/L (ref 101–111)
Creatinine, Ser: 0.7 mg/dL (ref 0.44–1.00)
GFR calc Af Amer: >60 mL/min (ref >60)
GFR calc non Af Amer: >60 mL/min (ref >60)
Glucose, Bld: 138 mg/dL — ABNORMAL HIGH (ref 65–99)
Potassium: 4.2 mmol/L (ref 3.5–5.1)
Sodium: 137 mmol/L (ref 135–145)

## 2014-12-09 LAB — CBC
HCT: 30.2 % — ABNORMAL LOW (ref 36.0–46.0)
HEMOGLOBIN: 9.8 g/dL — AB (ref 12.0–15.0)
MCH: 31.2 pg (ref 26.0–34.0)
MCHC: 32.5 g/dL (ref 30.0–36.0)
MCV: 96.2 fL (ref 78.0–100.0)
PLATELETS: 180 10*3/uL (ref 150–400)
RBC: 3.14 MIL/uL — AB (ref 3.87–5.11)
RDW: 14.2 % (ref 11.5–15.5)
WBC: 5.7 10*3/uL (ref 4.0–10.5)

## 2014-12-09 MED ORDER — METHOCARBAMOL 500 MG PO TABS: 500.0000 mg | ORAL_TABLET | Freq: Four times a day (QID) | ORAL | Status: DC | PRN

## 2014-12-09 MED ORDER — ASPIRIN 325 MG PO TBEC
325.0000 mg | DELAYED_RELEASE_TABLET | Freq: Two times a day (BID) | ORAL | Status: DC
Start: 2014-12-09 — End: 2016-06-10

## 2014-12-09 MED ORDER — OXYCODONE HCL 5 MG PO TABS: 5.0000 mg | ORAL_TABLET | ORAL | Status: DC | PRN

## 2014-12-09 NOTE — Progress Notes (Signed)
Subjective: 1 Day Post-Op Procedure(s) (LRB): RIGHT TOTAL HIP ARTHROPLASTY ANTERIOR APPROACH (Right) Patient reports pain as moderate.  Has been up with therapy yesterday.  Asymptomatic acute blood loss anemia.  Objective: Vital signs in last 24 hours: Temp:  [97.2 F (36.2 C)-99.7 F (37.6 C)] 99.7 F (37.6 C) (08/06 0526) Pulse Rate:  [58-88] 88 (08/06 0526) Resp:  [13-19] 16 (08/06 0526) BP: (116-154)/(47-76) 118/54 mmHg (08/06 0526) SpO2:  [92 %-100 %] 100 % (08/06 0526)  Intake/Output from previous day: 08/05 0701 - 08/06 0700 In: 3250 [P.O.:840; I.V.:2410] Out: 3400 [Urine:3000; Blood:400] Intake/Output this shift:     Recent Labs  12/09/14 0440  HGB 9.8*    Recent Labs  12/09/14 0440  WBC 5.7  RBC 3.14*  HCT 30.2*  PLT 180    Recent Labs  12/09/14 0440  NA 137  K 4.2  CL 104  CO2 26  BUN 11  CREATININE 0.70  GLUCOSE 138*  CALCIUM 8.6*   No results for input(s): LABPT, INR in the last 72 hours.  Sensation intact distally Intact pulses distally Dorsiflexion/Plantar flexion intact Incision: scant drainage  Assessment/Plan: 1 Day Post-Op Procedure(s) (LRB): RIGHT TOTAL HIP ARTHROPLASTY ANTERIOR APPROACH (Right) Up with therapy Discharge home with home health later today vs tomorrow.  BLACKMAN,CHRISTOPHER Y 12/09/2014, 7:17 AM

## 2014-12-09 NOTE — Evaluation (Signed)
Occupational Therapy Evaluation Patient Details Name: Kimberly Johns MRN: [id number] DOB: 1940/06/22 Today's Date: 12/09/2014    History of Present Illness R THR DA   Clinical Impression   This 74 year old female was admitted for the above surgery   All education was completed.  No further OT is needed at this time.    Follow Up Recommendations  No OT follow up    Equipment Recommendations  None recommended by OT    Recommendations for Other Services       Precautions / Restrictions Precautions Precautions: Fall Restrictions Other Position/Activity Restrictions: WBAT      Mobility Bed Mobility               General bed mobility comments: oob with NT  Transfers   Equipment used: Rolling walker (2 wheeled) Transfers: Sit to/from Stand Sit to Stand: Min guard         General transfer comment: cues for UE/LE placement    Balance                                            ADL Overall ADL's : Needs assistance/impaired     Grooming: Oral care;Supervision/safety;Standing   Upper Body Bathing: Set up;Sitting   Lower Body Bathing: Min guard;With adaptive equipment   Upper Body Dressing : Set up;Sitting   Lower Body Dressing: Min guard;With adaptive equipment   Toilet Transfer: Min guard;Ambulation   Toileting- Clothing Manipulation and Hygiene: Min guard;Sit to/from stand         General ADL Comments: Completed ADL from [facility] in bathroom. Pt has reacher at home.  Explained that she can use this with washcloth if she doesn't have long sponge.  She will plan to go without socks and wear slip on shoes.  They have a system to keep 4 large dogs contained away from her initially..  Reinforced working within pain tolerance.     Vision     Perception     Praxis      Pertinent Vitals/Pain Pain Assessment: Faces Faces Pain Scale: Hurts even more Pain Location: R hip Pain Descriptors / Indicators: Aching Pain Intervention(s):  Limited activity within patient's tolerance;Monitored during session;Premedicated before session;Repositioned;Ice applied     Hand Dominance     Extremity/Trunk Assessment Upper Extremity Assessment Upper Extremity Assessment: Overall WFL for tasks assessed           Communication Communication Communication: No difficulties   Cognition Arousal/Alertness: Awake/alert Behavior During Therapy: WFL for tasks assessed/performed Overall Cognitive Status: Within Functional Limits for tasks assessed                     General Comments       Exercises       Shoulder Instructions      Home Living Family/patient expects to be discharged to:: Private residence Living Arrangements: Spouse/significant other Available Help at Discharge: Family               Bathroom Shower/Tub: Chief Strategy Officer: Standard     Home Equipment: Environmental consultant - 2 wheels;Cane - single point   Additional Comments: plans to sponge bathe      Prior Functioning/Environment Level of Independence: Independent with assistive device(s)        Comments: husband has had some recent health issues but doing better; brother available to assist  OT Diagnosis: Acute pain   OT Problem List:     OT Treatment/Interventions:      OT Goals(Current goals can be found in the care plan section) Acute Rehab OT Goals Patient Stated Goal: Resume previous lifestyle with decreased pain  OT Frequency:     Barriers to D/C:            Co-evaluation              End of Session    Activity Tolerance: Patient tolerated treatment well Patient left: in chair;with call bell/phone within reach   Time: 0914-0942 OT Time Calculation (min): 28 min Charges:  OT General Charges $OT Visit: 1 Procedure OT Evaluation $Initial OT Evaluation Tier I: 1 Procedure OT Treatments $Self Care/Home Management : 8-22 mins G-Codes:    Cirilo Canner 11-Dec-2014, 10:17 AM   Marica Otter,  OTR/L [phone] 11-Dec-2014

## 2014-12-09 NOTE — Discharge Instructions (Signed)

## 2014-12-09 NOTE — Discharge Summary (Signed)
Patient ID: Kimberly Johns MRN: [id number] DOB/AGE: [birth year] 74 y.o.  Admit date: 12/08/2014 Discharge date: 12/09/2014  Admission Diagnoses:  Principal Problem:   Osteoarthritis of right hip Active Problems:   Status post total replacement of right hip   Discharge Diagnoses:  Same  Past Medical History  Diagnosis Date  . Diverticulosis   . HLD (hyperlipidemia)   . GERD (gastroesophageal reflux disease)   . Colon polyps   . Gallbladder disease   . Liver cyst   . Kidney cysts   . Hypothyroidism   . Fracture of ankle, trimalleolar, left, closed   . Arthritis   . Shingles     hx of   . History of chicken pox   . Measles     hx of   . Mumps     hx of     Surgeries: Procedure(s): RIGHT TOTAL HIP ARTHROPLASTY ANTERIOR APPROACH on 12/08/2014   Consultants:    Discharged Condition: Improved  Hospital Course: Kimberly Johns is an 74 y.o. female who was admitted 12/08/2014 for operative treatment ofOsteoarthritis of right hip. Patient has severe unremitting pain that affects sleep, daily activities, and work/hobbies. After pre-op clearance the patient was taken to the operating room on 12/08/2014 and underwent  Procedure(s): RIGHT TOTAL HIP ARTHROPLASTY ANTERIOR APPROACH.    Patient was given perioperative antibiotics: Anti-infectives    Start     Dose/Rate Route Frequency Ordered Stop   12/08/14 1400  ceFAZolin (ANCEF) IVPB 1 g/50 mL premix     1 g 100 mL/hr over 30 Minutes Intravenous Every 6 hours 12/08/14 1029 12/08/14 2033   12/08/14 0537  ceFAZolin (ANCEF) IVPB 2 g/50 mL premix     2 g 100 mL/hr over 30 Minutes Intravenous On call to O.R. 12/08/14 9528 12/08/14 4132       Patient was given sequential compression devices, early ambulation, and chemoprophylaxis to prevent DVT.  Patient benefited maximally from hospital stay and there were no complications.    Recent vital signs: Patient Vitals for the past 24 hrs:  BP Temp Temp src Pulse Resp SpO2  12/09/14 0526  (!) 118/54 mmHg 99.7 F (37.6 C) Oral 88 16 100 %  12/08/14 2359 (!) 116/55 mmHg 98.7 F (37.1 C) Oral 75 16 100 %  12/08/14 2122 (!) 117/47 mmHg 98.5 F (36.9 C) Oral 84 16 99 %  12/08/14 1739 126/66 mmHg 97.7 F (36.5 C) Oral 88 16 92 %  12/08/14 1330 (!) 125/56 mmHg 98.7 F (37.1 C) - 64 16 100 %  12/08/14 1230 (!) 131/58 mmHg 98 F (36.7 C) - (!) 58 16 98 %  12/08/14 1130 139/66 mmHg 97.8 F (36.6 C) - 60 18 100 %  12/08/14 1023 (!) 145/63 mmHg 97.8 F (36.6 C) - 62 14 99 %  12/08/14 1000 (!) 154/76 mmHg - - 69 13 100 %  12/08/14 0945 (!) 149/73 mmHg - - 72 19 100 %  12/08/14 0930 139/68 mmHg 97.2 F (36.2 C) - 86 15 100 %     Recent laboratory studies:  Recent Labs  12/09/14 0440  WBC 5.7  HGB 9.8*  HCT 30.2*  PLT 180  NA 137  K 4.2  CL 104  CO2 26  BUN 11  CREATININE 0.70  GLUCOSE 138*  CALCIUM 8.6*     Discharge Medications:     Medication List    STOP taking these medications        celecoxib 200 MG capsule  Commonly known  as:  CELEBREX     oxyCODONE-acetaminophen 5-325 MG per tablet  Commonly known as:  PERCOCET/ROXICET      TAKE these medications        aspirin 325 MG EC tablet  Take 1 tablet (325 mg total) by mouth 2 (two) times daily after a meal.     calcium-vitamin D 500-200 MG-UNIT per tablet  Commonly known as:  OSCAL WITH D  Take 1 tablet by mouth daily with breakfast.     estrogen (conjugated)-medroxyprogesterone 0.3-1.5 MG per tablet  Commonly known as:  PREMPRO  Take 1 tablet by mouth at bedtime.     Glucosamine-Chondroitin-MSM [phone] MG Tabs  Take 1 tablet by mouth at bedtime.     levothyroxine 88 MCG tablet  Commonly known as:  SYNTHROID, LEVOTHROID  Take 88 mcg by mouth daily.     METAMUCIL 30.9 % Powd  Generic drug:  Psyllium  Take 1 packet by mouth at bedtime.     methocarbamol 500 MG tablet  Commonly known as:  ROBAXIN  Take 1 tablet (500 mg total) by mouth every 6 (six) hours as needed for muscle  spasms.     omeprazole 40 MG capsule  Commonly known as:  PRILOSEC  Take 40 mg by mouth every morning.     oxyCODONE 5 MG immediate release tablet  Commonly known as:  Oxy IR/ROXICODONE  Take 1-3 tablets (5-15 mg total) by mouth every 3 (three) hours as needed for breakthrough pain.     pravastatin 40 MG tablet  Commonly known as:  PRAVACHOL  Take 40 mg by mouth every evening.     Vitamin D3 5000 UNITS Caps  Take 1 capsule by mouth daily.     WOMENS MULTIVITAMIN PLUS Tabs  Take 1 tablet by mouth at bedtime.        Diagnostic Studies: Dg C-arm 1-60 Min-no Report  12/08/2014   CLINICAL DATA: hip   C-ARM 1-60 MINUTES  Fluoroscopy was utilized by the requesting physician.  No radiographic  interpretation.    Dg Hip Port Unilat With Pelvis 1v Right  12/08/2014   CLINICAL DATA:  Right hip surgery.  EXAM: DG HIP (WITH OR WITHOUT PELVIS) 1V PORT RIGHT  COMPARISON:  Intraoperative radiograph 12/08/2014.  FINDINGS: The right total hip arthroplasty is in place. The hip is located. There is gas within the joint space as expected. The pelvis is intact.  IMPRESSION: Status post right total hip arthroplasty without radiographic evidence for complication.   Electronically Signed   By: Marin Roberts M.D.   On: 12/08/2014 10:20   Dg Hip Unilat With Pelvis 2-3 Views Right  12/08/2014   CLINICAL DATA:  Right hip replacement.  EXAM: DG HIP (WITH OR WITHOUT PELVIS) 2-3V RIGHT  COMPARISON:  None.  FINDINGS: Total right hip replacement with good anatomic alignment. Hardware intact. No acute bony abnormality .  IMPRESSION: Total right hip replacement with good anatomic alignment .   Electronically Signed   By: Maisie Fus  Register   On: 12/08/2014 09:46    Disposition: 01-Home or Self Care        Follow-up Information    Follow up with Kathryne Hitch, MD In 2 weeks.   Specialty:  Orthopedic Surgery   Contact information:   [address] [place] [address] [phone]         Signed: Kathryne Hitch 12/09/2014, 7:20 AM

## 2014-12-09 NOTE — Progress Notes (Signed)
Physical Therapy Treatment Patient Details Name: OLIANA Johns MRN: [id number] DOB: [birth year] Today's Date: Jan 04, 2015    History of Present Illness R THR    PT Comments    Pt motivated and progressing well with mobility.    Follow Up Recommendations  Home health PT     Equipment Recommendations  None recommended by PT    Recommendations for Other Services OT consult     Precautions / Restrictions Precautions Precautions: Fall Restrictions Weight Bearing Restrictions: No Other Position/Activity Restrictions: WBAT    Mobility  Bed Mobility               General bed mobility comments: oob with nursing  Transfers Overall transfer level: Needs assistance Equipment used: Rolling walker (2 wheeled) Transfers: Sit to/from Stand Sit to Stand: Min guard         General transfer comment: cues for UE/LE placement  Ambulation/Gait Ambulation/Gait assistance: Min assist Ambulation Distance (Feet): 130 Feet Assistive device: Rolling walker (2 wheeled) Gait Pattern/deviations: Step-to pattern;Shuffle;Decreased step length - right;Decreased step length - left;Trunk flexed Gait velocity: decr   General Gait Details: cues for sequence, posture and position from [name]            Wheelchair Mobility    Modified Rankin (Stroke Patients Only)       Balance                                    Cognition Arousal/Alertness: Awake/alert Behavior During Therapy: WFL for tasks assessed/performed Overall Cognitive Status: Within Functional Limits for tasks assessed                      Exercises Total Joint Exercises Ankle Circles/Pumps: AROM;Both;15 reps;Supine Quad Sets: AROM;Both;10 reps;Supine Heel Slides: AAROM;Right;20 reps;Supine Hip ABduction/ADduction: AAROM;Right;15 reps;Supine    General Comments        Pertinent Vitals/Pain Pain Assessment: 0-10 Pain Score: 5  Pain Location: R hip Pain Descriptors /  Indicators: Aching;Sore Pain Intervention(s): Limited activity within patient's tolerance;Monitored during session;Premedicated before session;Ice applied    Home Living                      Prior Function            PT Goals (current goals can now be found in the care plan section) Acute Rehab PT Goals Patient Stated Goal: Resume previous lifestyle with decreased pain PT Goal Formulation: With patient Time For Goal Achievement: 12/15/14 Potential to Achieve Goals: Good Progress towards PT goals: Progressing toward goals    Frequency  7X/week    PT Plan Current plan remains appropriate    Co-evaluation             End of Session Equipment Utilized During Treatment: Gait belt Activity Tolerance: Patient tolerated treatment well Patient left: in chair;with call bell/phone within reach     Time: 1007-1040 PT Time Calculation (min) (ACUTE ONLY): 33 min  Charges:  $Gait Training: 8-22 mins $Therapeutic Exercise: 8-22 mins                    G Codes:      Larance Ratledge 01/04/15, 3:58 PM

## 2014-12-09 NOTE — Plan of Care (Signed)
Problem: Phase III Progression Outcomes Goal: Anticoagulant follow-up in place Outcome: Not Applicable Date Met:  00/05/05 asa

## 2014-12-09 NOTE — Progress Notes (Signed)
Physical Therapy Treatment Patient Details Name: Kimberly Johns MRN: [id number] DOB: 07-02-1940 Today's Date: Dec 28, 2014    History of Present Illness R THR    PT Comments    Pt progressing well with mobility and hopeful for dc tomorrow.  Follow Up Recommendations  Home health PT     Equipment Recommendations  None recommended by PT    Recommendations for Other Services OT consult     Precautions / Restrictions Precautions Precautions: Fall Restrictions Weight Bearing Restrictions: No Other Position/Activity Restrictions: WBAT    Mobility  Bed Mobility Overal bed mobility: Needs Assistance Bed Mobility: Sit to Supine       Sit to supine: Min assist   General bed mobility comments: cues for sequence and use of UEs to self assist  Transfers Overall transfer level: Needs assistance Equipment used: Rolling walker (2 wheeled) Transfers: Sit to/from Stand Sit to Stand: Min guard         General transfer comment: cues for UE/LE placement  Ambulation/Gait Ambulation/Gait assistance: Min assist Ambulation Distance (Feet): 300 Feet Assistive device: Rolling walker (2 wheeled) Gait Pattern/deviations: Step-to pattern;Step-through pattern;Decreased step length - right;Decreased step length - left;Shuffle;Trunk flexed Gait velocity: decr   General Gait Details: cues for sequence, posture and position from [name]            Wheelchair Mobility    Modified Rankin (Stroke Patients Only)       Balance                                    Cognition Arousal/Alertness: Awake/alert Behavior During Therapy: WFL for tasks assessed/performed Overall Cognitive Status: Within Functional Limits for tasks assessed                      Exercises      General Comments        Pertinent Vitals/Pain Pain Assessment: 0-10 Pain Score: 4  Pain Location: R hip Pain Descriptors / Indicators: Aching;Sore Pain Intervention(s): Limited  activity within patient's tolerance;Monitored during session;Premedicated before session;Ice applied    Home Living                      Prior Function            PT Goals (current goals can now be found in the care plan section) Acute Rehab PT Goals Patient Stated Goal: Resume previous lifestyle with decreased pain PT Goal Formulation: With patient Time For Goal Achievement: 12/15/14 Potential to Achieve Goals: Good Progress towards PT goals: Progressing toward goals    Frequency  7X/week    PT Plan Current plan remains appropriate    Co-evaluation             End of Session Equipment Utilized During Treatment: Gait belt Activity Tolerance: Patient tolerated treatment well Patient left: in bed;with call bell/phone within reach     Time: 1412-1440 PT Time Calculation (min) (ACUTE ONLY): 28 min  Charges:  $Gait Training: 23-37 mins                    G Codes:      Kimberly Johns 28-Dec-2014, 5:13 PM

## 2014-12-09 NOTE — Care Management Note (Signed)
Case Management Note  Patient Details  Name: NAZARENE BUNNING MRN: [id number] Date of Birth: [birth year]  Subjective/Objective:                   Right Total Hip Action/Plan:  Home Health  Expected Discharge Date:   12/09/14               Expected Discharge Plan:   Home with Home Health Services  In-House Referral:     Discharge planning Services  CM Consult  Post Acute Care Choice:  Home Health Choice offered to:  Patient  DME Arranged:    DME Agency:    HH Arranged:  PT HH Agency:   [facility]  Status of Service:  Completed, signed off  Medicare Important Message Given:    Date Medicare IM Given:    Medicare IM give by:    Date Additional Medicare IM Given:    Additional Medicare Important Message give by:     If discussed at Long Length of Stay Meetings, dates discussed:    Additional Comments:  CM spoke with patient at the bedside. [facility] was set-up pre-operatively, patient agrees. Has a 3N1, grabber and RW at home. Patient's husband and brother will assist her at home.   Antony Haste, RN 12/09/2014, 11:16 AM

## 2014-12-10 LAB — CBC
HCT: 28.3 % — ABNORMAL LOW (ref 36.0–46.0)
Hemoglobin: 9.6 g/dL — ABNORMAL LOW (ref 12.0–15.0)
MCH: 32.4 pg (ref 26.0–34.0)
MCHC: 33.9 g/dL (ref 30.0–36.0)
MCV: 95.6 fL (ref 78.0–100.0)
PLATELETS: 197 10*3/uL (ref 150–400)
RBC: 2.96 MIL/uL — AB (ref 3.87–5.11)
RDW: 14.2 % (ref 11.5–15.5)
WBC: 6.8 10*3/uL (ref 4.0–10.5)

## 2014-12-10 MED ORDER — ONDANSETRON HCL 4 MG PO TABS: 4.0000 mg | ORAL_TABLET | Freq: Three times a day (TID) | ORAL | Status: DC | PRN

## 2014-12-10 NOTE — Progress Notes (Signed)
Received discharge order via phone from Dr Ophelia Charter. Kimberly Johns, [employer]

## 2014-12-10 NOTE — Plan of Care (Signed)
Problem: Discharge Progression Outcomes Goal: Anticoagulant follow-up in place Outcome: Not Applicable Date Met:  12/10/14 asa     

## 2014-12-10 NOTE — Progress Notes (Signed)
Discharged from floor via w/c, belongings & family with pt. No changes in assessment. Kimberly Johns  

## 2014-12-10 NOTE — Progress Notes (Signed)
Physical Therapy Treatment Patient Details Name: Kimberly Johns MRN: [id number] DOB: 01-26-1941 Today's Date: 12/10/2014    History of Present Illness R THR    PT Comments    Pt progressing well with mobility and eager for return home.  Reviewed theres, stairs and car transfers.  Follow Up Recommendations  Home health PT     Equipment Recommendations  None recommended by PT    Recommendations for Other Services OT consult     Precautions / Restrictions Precautions Precautions: Fall Restrictions Weight Bearing Restrictions: No Other Position/Activity Restrictions: WBAT    Mobility  Bed Mobility Overal bed mobility: Needs Assistance Bed Mobility: Supine to Sit     Supine to sit: Supervision     General bed mobility comments: cues for sequence and use of UEs to self assist  Transfers Overall transfer level: Needs assistance Equipment used: Rolling walker (2 wheeled) Transfers: Sit to/from Stand Sit to Stand: Supervision         General transfer comment: cues for use of UEs  Ambulation/Gait Ambulation/Gait assistance: Min guard;Supervision Ambulation Distance (Feet): 300 Feet Assistive device: Rolling walker (2 wheeled) Gait Pattern/deviations: Step-to pattern;Step-through pattern;Shuffle;Trunk flexed Gait velocity: decr   General Gait Details: cues for sequence, posture and position from RW   Stairs Stairs: Yes Stairs assistance: Min assist Stair Management: No rails;Step to pattern;Backwards;With walker Number of Stairs: 4 General stair comments: 2 steps twice with cues for sequence and foot/RW placement.  Written instruction provided  Wheelchair Mobility    Modified Rankin (Stroke Patients Only)       Balance                                    Cognition Arousal/Alertness: Awake/alert Behavior During Therapy: WFL for tasks assessed/performed Overall Cognitive Status: Within Functional Limits for tasks assessed                       Exercises Total Joint Exercises Ankle Circles/Pumps: AROM;Both;15 reps;Supine Quad Sets: AROM;Both;10 reps;Supine Gluteal Sets: AROM;Both;10 reps;Supine Heel Slides: AAROM;Right;20 reps;Supine Hip ABduction/ADduction: AAROM;Right;Supine;20 reps Long Arc Quad: 10 reps;Seated;Right;AAROM;AROM    General Comments        Pertinent Vitals/Pain Pain Assessment: 0-10 Pain Score: 4  Pain Location: R hip Pain Descriptors / Indicators: Aching;Sore Pain Intervention(s): Limited activity within patient's tolerance;Monitored during session;Premedicated before session;Ice applied    Home Living                      Prior Function            PT Goals (current goals can now be found in the care plan section) Acute Rehab PT Goals Patient Stated Goal: Resume previous lifestyle with decreased pain PT Goal Formulation: With patient Time For Goal Achievement: 12/15/14 Potential to Achieve Goals: Good Progress towards PT goals: Progressing toward goals    Frequency  7X/week    PT Plan Current plan remains appropriate    Co-evaluation             End of Session Equipment Utilized During Treatment: Gait belt Activity Tolerance: Patient tolerated treatment well Patient left: in chair;with call bell/phone within reach     Time: 0825-0905 PT Time Calculation (min) (ACUTE ONLY): 40 min  Charges:  $Gait Training: 8-22 mins $Therapeutic Exercise: 8-22 mins $Therapeutic Activity: 8-22 mins  G Codes:      Kimberly Johns 01-06-15, 9:09 AM

## 2014-12-10 NOTE — Progress Notes (Signed)
Subjective: 2 Days Post-Op Procedure(s) (LRB): RIGHT TOTAL HIP ARTHROPLASTY ANTERIOR APPROACH (Right) Patient reports pain as mild.    Objective: Vital signs in last 24 hours: Temp:  [98.3 F (36.8 C)-99.9 F (37.7 C)] 99.9 F (37.7 C) (08/07 0545) Pulse Rate:  [87-97] 97 (08/07 0545) Resp:  [16] 16 (08/07 0545) BP: (103-118)/(51-55) 110/55 mmHg (08/07 0545) SpO2:  [92 %-100 %] 94 % (08/07 0545)  Intake/Output from previous day: 08/06 0701 - 08/07 0700 In: 1741.3 [P.O.:960; I.V.:781.3] Out: 3150 [Urine:2950] Intake/Output this shift:     Recent Labs  12/09/14 0440 12/10/14 0535  HGB 9.8* 9.6*    Recent Labs  12/09/14 0440 12/10/14 0535  WBC 5.7 6.8  RBC 3.14* 2.96*  HCT 30.2* 28.3*  PLT 180 197    Recent Labs  12/09/14 0440  NA 137  K 4.2  CL 104  CO2 26  BUN 11  CREATININE 0.70  GLUCOSE 138*  CALCIUM 8.6*   No results for input(s): LABPT, INR in the last 72 hours.  Neurologically intact  Assessment/Plan: 2 Days Post-Op Procedure(s) (LRB): RIGHT TOTAL HIP ARTHROPLASTY ANTERIOR APPROACH (Right) Up with therapy discharge home. Doing well   Xariah Silvernail C 12/10/2014, 1:23 PM

## 2015-05-06 MED FILL — OMEPRAZOLE DR 40 MG CAPSULE: 40 | 90 days supply | Qty: 90 | Fill #2

## 2015-06-06 MED FILL — PRAVASTATIN NA 40 MG TAB: 40 | 30 days supply | Qty: 30 | Fill #7

## 2015-06-08 MED FILL — LEVOTHYROXINE 88 MCG TABLET: 88 | 30 days supply | Qty: 30 | Fill #5

## 2015-06-11 DIAGNOSIS — M1990 Unspecified osteoarthritis, unspecified site: Secondary | ICD-10-CM | POA: Diagnosis not present

## 2015-06-11 DIAGNOSIS — Z0000 Encounter for general adult medical examination without abnormal findings: Secondary | ICD-10-CM | POA: Diagnosis not present

## 2015-06-11 DIAGNOSIS — E039 Hypothyroidism, unspecified: Secondary | ICD-10-CM | POA: Diagnosis not present

## 2015-06-11 DIAGNOSIS — M8580 Other specified disorders of bone density and structure, unspecified site: Secondary | ICD-10-CM | POA: Diagnosis not present

## 2015-06-11 DIAGNOSIS — E785 Hyperlipidemia, unspecified: Secondary | ICD-10-CM | POA: Diagnosis not present

## 2015-06-11 DIAGNOSIS — I700 Atherosclerosis of aorta: Secondary | ICD-10-CM | POA: Diagnosis not present

## 2015-06-20 MED FILL — DICLOFENAC SODIUM 1% GEL: 1 | 90 days supply | Qty: 300 | Fill #0

## 2015-07-02 MED FILL — PRAVASTATIN NA 40 MG TAB: 40 | 30 days supply | Qty: 30 | Fill #8

## 2015-07-03 MED FILL — LEVOTHYROXINE 88 MCG TABLET: 88 | 30 days supply | Qty: 34 | Fill #0

## 2015-07-09 MED FILL — PREMPRO 0.3 MG-1.5 MG TAB: 0.3-1.5 | 84 days supply | Qty: 84 | Fill #0

## 2015-07-23 DIAGNOSIS — E039 Hypothyroidism, unspecified: Secondary | ICD-10-CM | POA: Diagnosis not present

## 2015-07-30 MED FILL — PRAVASTATIN NA 40 MG TAB: 40 | 30 days supply | Qty: 30 | Fill #9

## 2015-07-31 MED FILL — LEVOTHYROXINE 88 MCG TABLET: 88 | 30 days supply | Qty: 34 | Fill #1

## 2015-08-01 DIAGNOSIS — Z01419 Encounter for gynecological examination (general) (routine) without abnormal findings: Secondary | ICD-10-CM | POA: Diagnosis not present

## 2015-08-01 DIAGNOSIS — Z1231 Encounter for screening mammogram for malignant neoplasm of breast: Secondary | ICD-10-CM | POA: Diagnosis not present

## 2015-08-04 MED FILL — OMEPRAZOLE DR 40 MG CAPSULE: 40 | 90 days supply | Qty: 90 | Fill #3

## 2015-09-02 MED FILL — PRAVASTATIN NA 40 MG TAB: 40 | 30 days supply | Qty: 30 | Fill #10

## 2015-09-03 MED FILL — LEVOTHYROXINE 88 MCG TABLET: 88 | 30 days supply | Qty: 34 | Fill #2

## 2015-09-30 MED FILL — PRAVASTATIN NA 40 MG TAB: 40 | 30 days supply | Qty: 30 | Fill #11

## 2015-10-02 MED FILL — PREMPRO 0.3 MG-1.5 MG TAB: 0.3-1.5 | 84 days supply | Qty: 84 | Fill #0

## 2015-10-03 MED FILL — LEVOTHYROXINE 88 MCG TABLET: 88 | 30 days supply | Qty: 34 | Fill #3

## 2015-10-29 MED FILL — PRAVASTATIN NA 40 MG TAB: 40 | 30 days supply | Qty: 30 | Fill #0

## 2015-11-01 MED FILL — OMEPRAZOLE DR 40 MG CAPSULE: 40 | 90 days supply | Qty: 90 | Fill #0

## 2015-11-01 MED FILL — LEVOTHYROXINE 88 MCG TABLET: 88 | 30 days supply | Qty: 34 | Fill #4

## 2015-11-27 MED FILL — PRAVASTATIN NA 40 MG TAB: 40 | 30 days supply | Qty: 30 | Fill #1

## 2015-12-02 MED FILL — LEVOTHYROXINE 88 MCG TABLET: 88 | 30 days supply | Qty: 34 | Fill #5

## 2015-12-11 DIAGNOSIS — Z96641 Presence of right artificial hip joint: Secondary | ICD-10-CM | POA: Diagnosis not present

## 2015-12-11 DIAGNOSIS — M1611 Unilateral primary osteoarthritis, right hip: Secondary | ICD-10-CM | POA: Diagnosis not present

## 2015-12-29 DIAGNOSIS — Z23 Encounter for immunization: Secondary | ICD-10-CM | POA: Diagnosis not present

## 2015-12-31 MED FILL — PRAVASTATIN NA 40 MG TAB: 40 | 30 days supply | Qty: 30 | Fill #2

## 2015-12-31 MED FILL — LEVOTHYROXINE 88 MCG TABLET: 88 | 30 days supply | Qty: 34 | Fill #0

## 2015-12-31 MED FILL — PREMPRO 0.3 MG-1.5 MG TAB: 0.3-1.5 | 84 days supply | Qty: 84 | Fill #0

## 2016-02-05 MED FILL — PRAVASTATIN NA 40 MG TAB: 40 | 90 days supply | Qty: 90 | Fill #3

## 2016-02-05 MED FILL — LEVOTHYROXINE 88 MCG TABLET: 88 | 84 days supply | Qty: 96 | Fill #1

## 2016-02-12 MED FILL — OMEPRAZOLE DR 40 MG CAPSULE: 40 | 90 days supply | Qty: 90 | Fill #1

## 2016-02-29 DIAGNOSIS — H35033 Hypertensive retinopathy, bilateral: Secondary | ICD-10-CM | POA: Diagnosis not present

## 2016-02-29 DIAGNOSIS — H35372 Puckering of macula, left eye: Secondary | ICD-10-CM | POA: Diagnosis not present

## 2016-02-29 DIAGNOSIS — H2513 Age-related nuclear cataract, bilateral: Secondary | ICD-10-CM | POA: Diagnosis not present

## 2016-02-29 DIAGNOSIS — H25013 Cortical age-related cataract, bilateral: Secondary | ICD-10-CM | POA: Diagnosis not present

## 2016-04-09 ENCOUNTER — Ambulatory Visit (INDEPENDENT_AMBULATORY_CARE_PROVIDER_SITE_OTHER): Payer: PPO | Admitting: Orthopaedic Surgery

## 2016-04-09 DIAGNOSIS — M1612 Unilateral primary osteoarthritis, left hip: Secondary | ICD-10-CM

## 2016-04-09 MED ORDER — CELECOXIB 200 MG PO CAPS: 200.0000 mg | ORAL_CAPSULE | Freq: Two times a day (BID) | ORAL | 2 refills | Status: DC

## 2016-04-09 MED ORDER — HYDROCODONE-ACETAMINOPHEN 5-325 MG PO TABS: 1.0000 | ORAL_TABLET | Freq: Four times a day (QID) | ORAL | 0 refills | Status: DC | PRN

## 2016-04-09 MED FILL — PREMPRO 0.3 MG-1.5 MG TAB: 0.3-1.5 | 84 days supply | Qty: 84 | Fill #1

## 2016-04-09 MED FILL — HYDROCODON-APAP 5-325: 5-325 | 3 days supply | Qty: 30 | Fill #0

## 2016-04-09 MED FILL — CELECOXIB 200 MG CAPSULE: 200 | 30 days supply | Qty: 60 | Fill #0

## 2016-04-09 NOTE — Progress Notes (Signed)
Office Visit Note   Patient: Kimberly Johns           Date of Birth: 1940/09/21           MRN: [id number] Visit Date: 04/09/2016              Requested by: Shirlean Mylararol Webb, MD [address] [place], [place] [address] PCP: Frederich ChickWEBB, CAROL D, MD   Assessment & Plan: Visit Diagnoses:  1. Primary osteoarthritis of left hip     Plan: Left hip intra-articular injection. She will take Celebrex 200 mg 1-2 as needed daily.  Norco for night pain she is to use these sparingly   Follow-Up Instructions: Return if symptoms worsen or fail to improve.   Orders:  Orders Placed This Encounter  Procedures  . Ambulatory referral to Physical Medicine Rehab   Meds ordered this encounter  Medications  . celecoxib (CELEBREX) 200 MG capsule    Sig: Take 1 capsule (200 mg total) by mouth 2 (two) times daily.    Dispense:  60 capsule    Refill:  2  . HYDROcodone-acetaminophen (NORCO) 5-325 MG tablet    Sig: Take 1 tablet by mouth every 6 (six) hours as needed for moderate pain. One to two tabs every 4-6 hours for pain    Dispense:  30 tablet    Refill:  0      Procedures: No procedures performed   Clinical Data: No additional findings.   Subjective: Chief Complaint  Patient presents with  . Left Hip - Pain    Patient states her left hip is really bothering her. States she is going skiing in January and wants something to get through    HPI Kimberly Johns returns today stating that her left hip pain is getting worse is having trouble plate donning shoes and socks. Pain radiates down her thigh and sometimes to her knee area. Numbness or tingling down the legs. She did take some Celebrex and found this is helped. She has known left hip arthritis and a history of right total hip arthroplasty which is doing very well.  Review of Systems SEE HPI  Objective: Vital Signs: There were no vitals taken for this visit.  Physical Exam  Constitutional: She is oriented to person, place,  and time. She appears well-developed and well-nourished. No distress.  Cardiovascular: Intact distal pulses.   Neurological: She is alert and oriented to person, place, and time.  Psychiatric: She has a normal mood and affect.    Ortho Exam Good range of motion of the right hip without pain. She has guarding of the left hip with internal/external rotation and markedly decreased range of motion of the left hip. Good range of motion of the left knee without pain no tenderness. Lower extremity strength testing 5 out of 5 throughout against resistance. Straight leg raise negative bilaterally. No real tenderness over the trochanteric region of either hip. Sensation intact bilateral feet to light touch. Specialty Comments:  No specialty comments available.  Imaging: No results found.   PMFS History: Patient Active Problem List   Diagnosis Date Noted  . Osteoarthritis of right hip 12/08/2014  . Status post total replacement of right hip 12/08/2014  . Abdominal pain, epigastric 07/21/2012  . Esophageal reflux 07/21/2012  . Personal history of colonic polyps 07/21/2012   Past Medical History:  Diagnosis Date  . Arthritis   . Colon polyps   . Diverticulosis   . Fracture of ankle, trimalleolar, left, closed   .  Gallbladder disease   . GERD (gastroesophageal reflux disease)   . History of chicken pox   . HLD (hyperlipidemia)   . Hypothyroidism   . Kidney cysts   . Liver cyst   . Measles    hx of   . Mumps    hx of   . Shingles    hx of     Family History  Problem Relation Age of Onset  . Alzheimer's disease Mother [id number]  . Colon cancer Neg Hx   . Esophageal cancer Neg Hx   . Rectal cancer Neg Hx   . Stomach cancer Neg Hx     Past Surgical History:  Procedure Laterality Date  . ANKLE FRACTURE SURGERY Left 2009  . ANTERIOR CRUCIATE LIGAMENT REPAIR Left 2001  . APPENDECTOMY  2010  . COLONOSCOPY WITH PROPOFOL N/A 10/30/2014   Procedure: COLONOSCOPY WITH PROPOFOL;  Surgeon:  Charolett BumpersMartin K Johnson, MD;  Location: [facility];  Service: Endoscopy;  Laterality: N/A;  . TONSILLECTOMY     age 89  . TONSILLECTOMY    . TOTAL HIP ARTHROPLASTY Right 12/08/2014   Procedure: RIGHT TOTAL HIP ARTHROPLASTY ANTERIOR APPROACH;  Surgeon: Kathryne Hitchhristopher Y Blackman, MD;  Location: [facility];  Service: Orthopedics;  Laterality: Right;   Social History   Occupational History  . radiology tech    Social History Main Topics  . Smoking status: Former Smoker    Packs/day: 0.50    Years: 6.00    Types: Cigarettes    Quit date: 10/22/1968  . Smokeless tobacco: Never Used  . Alcohol use No  . Drug use: No  . Sexual activity: Not on file

## 2016-05-02 MED FILL — PRAVASTATIN NA 40 MG TAB: 40 | 90 days supply | Qty: 90 | Fill #4

## 2016-05-02 MED FILL — LEVOTHYROXINE 88 MCG TABLET: 88 | 30 days supply | Qty: 34 | Fill #0

## 2016-05-06 MED FILL — CELECOXIB 200 MG CAPSULE: 200 | 30 days supply | Qty: 60 | Fill #1

## 2016-05-09 ENCOUNTER — Ambulatory Visit (INDEPENDENT_AMBULATORY_CARE_PROVIDER_SITE_OTHER): Payer: PPO | Admitting: Physical Medicine and Rehabilitation

## 2016-05-09 ENCOUNTER — Encounter (INDEPENDENT_AMBULATORY_CARE_PROVIDER_SITE_OTHER): Payer: Self-pay | Admitting: Physical Medicine and Rehabilitation

## 2016-05-09 VITALS — BP 142/85 | HR 65

## 2016-05-09 DIAGNOSIS — M25552 Pain in left hip: Secondary | ICD-10-CM | POA: Diagnosis not present

## 2016-05-09 MED ORDER — LIDOCAINE HCL 2 % IJ SOLN
4.0000 mL | INTRAMUSCULAR | Status: AC | PRN
  Administered 2016-05-09: 4 mL

## 2016-05-09 MED ORDER — TRIAMCINOLONE ACETONIDE 40 MG/ML IJ SUSP
80.0000 mg | INTRAMUSCULAR | Status: AC | PRN
  Administered 2016-05-09: 80 mg via INTRA_ARTICULAR

## 2016-05-09 NOTE — Patient Instructions (Signed)

## 2016-05-09 NOTE — Progress Notes (Signed)
Kimberly Johns - 76 y.o. female MRN [id number]  Date of birth: [birth year]  Office Visit Note: Visit Date: 05/09/2016 PCP: Frederich Chick, MD Referred by: Shirlean Mylar, MD  Subjective: Chief Complaint  Patient presents with  . Left Hip - Pain   HPI: Kimberly Johns is a 76 year old female is very active with prior right total hip arthroplasty. We saw her for her right hip injection couple years ago of the operation. She now is complaining of left hip pain. Pain in left groin, down inner leg to ankle. Difficulty bending over. Symptoms for about 6 months. No contrast allergy.     ROS Otherwise per HPI.  Assessment & Plan: Visit Diagnoses:  1. Pain in left hip     Plan: Findings:  Left hip anesthetic and hopefully therapeutic arthrogram    Meds & Orders: No orders of the defined types were placed in this encounter.   Orders Placed This Encounter  Procedures  . Large Joint Injection/Arthrocentesis    Follow-up: Return With Dr. Magnus Ivan if symptoms worsen or fail to improve.   Procedures: Hip anesthetic arthrogram Date/Time: 05/09/2016 9:29 AM Performed by: Tyrell Antonio Authorized by: Tyrell Antonio   Consent Given by:  Patient Site marked: the procedure site was marked   Timeout: prior to procedure the correct patient, procedure, and site was verified   Indications:  Pain and diagnostic evaluation Location:  Hip Site:  L hip joint Prep: patient was prepped and draped in usual sterile fashion   Needle Size:  22 G Needle Length:  3.5 inches Approach:  Anterior Ultrasound Guidance: No   Fluoroscopic Guidance: No   Arthrogram: Yes   Medications:  4 mL lidocaine 2 %; 80 mg triamcinolone acetonide 40 MG/ML Aspiration Attempted: Yes   Patient tolerance:  Patient tolerated the procedure well with no immediate complications  Arthrogram demonstrated excellent flow of contrast throughout the joint surface without extravasation or obvious defect.  The patient had relief of  symptoms during the anesthetic phase of the injection.     No notes on file   Clinical History: No specialty comments available.  She reports that she quit smoking about 47 years ago. Her smoking use included Cigarettes. She has a 3.00 pack-year smoking history. She has never used smokeless tobacco. No results for input(s): HGBA1C, LABURIC in the last 8760 hours.  Objective:  VS:  HT:    WT:   BMI:     BP:(!) 142/85  HR:65bpm  TEMP: ( )  RESP:  Physical Exam  Musculoskeletal:  Decreased range of motion on the left with concordant hip pain with rotation.    Ortho Exam Imaging: No results found.  Past Medical/Family/Surgical/Social History: Medications & Allergies reviewed per EMR Patient Active Problem List   Diagnosis Date Noted  . Osteoarthritis of right hip 12/08/2014  . Status post total replacement of right hip 12/08/2014  . Abdominal pain, epigastric 07/21/2012  . Esophageal reflux 07/21/2012  . Personal history of colonic polyps 07/21/2012   Past Medical History:  Diagnosis Date  . Arthritis   . Colon polyps   . Diverticulosis   . Fracture of ankle, trimalleolar, left, closed   . Gallbladder disease   . GERD (gastroesophageal reflux disease)   . History of chicken pox   . HLD (hyperlipidemia)   . Hypothyroidism   . Kidney cysts   . Liver cyst   . Measles    hx of   . Mumps    hx of   .  Shingles    hx of    Family History  Problem Relation Age of Onset  . Alzheimer's disease Mother 6563  . Colon cancer Neg Hx   . Esophageal cancer Neg Hx   . Rectal cancer Neg Hx   . Stomach cancer Neg Hx    Past Surgical History:  Procedure Laterality Date  . ANKLE FRACTURE SURGERY Left 2009  . ANTERIOR CRUCIATE LIGAMENT REPAIR Left 2001  . APPENDECTOMY  2010  . COLONOSCOPY WITH PROPOFOL N/A 10/30/2014   Procedure: COLONOSCOPY WITH PROPOFOL;  Surgeon: Charolett BumpersMartin K Johnson, MD;  Location: [facility];  Service: Endoscopy;  Laterality: N/A;  . TONSILLECTOMY     age  89  . TONSILLECTOMY    . TOTAL HIP ARTHROPLASTY Right 12/08/2014   Procedure: RIGHT TOTAL HIP ARTHROPLASTY ANTERIOR APPROACH;  Surgeon: Kathryne Hitchhristopher Y Blackman, MD;  Location: [facility];  Service: Orthopedics;  Laterality: Right;   Social History   Occupational History  . radiology tech    Social History Main Topics  . Smoking status: Former Smoker    Packs/day: 0.50    Years: 6.00    Types: Cigarettes    Quit date: 10/22/1968  . Smokeless tobacco: Never Used  . Alcohol use No  . Drug use: No  . Sexual activity: Not on file

## 2016-05-14 MED FILL — OMEPRAZOLE DR 40 MG CAPSULE: 40 | 90 days supply | Qty: 90 | Fill #2

## 2016-05-26 ENCOUNTER — Ambulatory Visit (INDEPENDENT_AMBULATORY_CARE_PROVIDER_SITE_OTHER): Payer: PPO

## 2016-05-26 ENCOUNTER — Ambulatory Visit (INDEPENDENT_AMBULATORY_CARE_PROVIDER_SITE_OTHER): Payer: PPO | Admitting: Orthopaedic Surgery

## 2016-05-26 DIAGNOSIS — M25572 Pain in left ankle and joints of left foot: Secondary | ICD-10-CM | POA: Diagnosis not present

## 2016-05-26 MED ORDER — DOXYCYCLINE HYCLATE 100 MG PO CAPS: 100.0000 mg | ORAL_CAPSULE | Freq: Two times a day (BID) | ORAL | 0 refills | Status: DC

## 2016-05-26 MED FILL — DOXYCYCLINE HYCLATE 100 MG: 100 | 10 days supply | Qty: 20 | Fill #0

## 2016-05-26 NOTE — Progress Notes (Signed)
The patient is well-known to me. She is 76 year old is scheduled to undergo a total hip replacement left side in February of this year. She will need to look at her left ankle today. She's developed a wound over her lateral malleolus area of the ankle. She has remote surgery from 2009 from another surgeon in town where she underwent open reduction internal fixation of a bimalleolar ankle fracture. She's had problems with prominence of the hardware of her lateral aspect of her ankle. She's been doing well and she went skiing in the ski boots rubbed area on the ankle.  On examination of her left ankle there is a small wound of ankle but is no gross purulence or redness. He can tell that the hardware is prominent. Ankles were located and there is no swelling. Her to 3 views of the left ankle were obtained and show intact hardware with no evidence of loosening. There is no joint effusion. There is no evidence of osteolysis around the bone.  At this point we do have her set up for a total hip replacement in February. Due to the fact this is an anterior approach and her feet had to be in special traction boots with this surgery we would not be able to take the hardware at the same setting. I will have her try some Bactroban ointment on the wound daily and have her take just a 10 day course of Oxistat cycling. She also soak her ankles well. I was that something else is going on different the time of surgery room after he consider removing the hardware. She understands this as well.

## 2016-05-30 MED FILL — DICLOFENAC SODIUM 1% GEL: 1 | 90 days supply | Qty: 300 | Fill #1

## 2016-06-02 ENCOUNTER — Ambulatory Visit (INDEPENDENT_AMBULATORY_CARE_PROVIDER_SITE_OTHER): Payer: PPO | Admitting: Physician Assistant

## 2016-06-06 MED FILL — CELECOXIB 200 MG CAP: 200 | 30 days supply | Qty: 60 | Fill #2

## 2016-06-09 ENCOUNTER — Other Ambulatory Visit (INDEPENDENT_AMBULATORY_CARE_PROVIDER_SITE_OTHER): Payer: Self-pay | Admitting: Physician Assistant

## 2016-06-09 MED FILL — LEVOTHYROXINE 88 MCG TABLET: 88 | 30 days supply | Qty: 34 | Fill #1

## 2016-06-12 NOTE — Patient Instructions (Signed)
Kimberly Johns  06/12/2016   Your procedure is scheduled on: Friday 06/20/2016  [facility]  [place]trance [place]  [place]vators to 3rd floor to  Short Stay Center at   1010  AM.  Call this number if you have problems the morning of surgery [phone]   Remember: ONLY 1 PERSON MAY GO WITH YOU TO SHORT STAY TO GET  READY MORNING OF YOUR SURGERY.   Do not eat food or drink liquids :After Midnight.     Take these medicines the morning of surgery with A SIP OF WATER: Levothyroxine (Synthroid), Omeprazole (Prilosec)                                You may not have any metal on your body including hair pins and              piercings  Do not wear jewelry, make-up, lotions, powders or perfumes, deodorant             Do not wear nail polish.  Do not shave  48 hours prior to surgery.              Men may shave face and neck.   Do not bring valuables to the hospital. [facility] IS NOT             RESPONSIBLE   FOR VALUABLES.  Contacts, dentures or bridgework may not be worn into surgery.  Leave suitcase in the car. After surgery it may be brought to your room.                 Please read over the following fact sheets you were given: _____________________________________________________________________             [facility] - Preparing for Surgery Before surgery, you can play an important role.  Because skin is not sterile, your skin needs to be as free of germs as possible.  You can reduce the number of germs on your skin by washing with CHG (chlorahexidine gluconate) soap before surgery.  CHG is an antiseptic cleaner which kills germs and bonds with the skin to continue killing germs even after washing. Please DO NOT use if you have an allergy to CHG or antibacterial soaps.  If your skin becomes reddened/irritated stop using the CHG and inform your nurse when you arrive at Short Stay. Do not shave (including legs and underarms) for at least 48 hours  prior to the first CHG shower.  You may shave your face/neck. Please follow these instructions carefully:  1.  Shower with CHG Soap the night before surgery and the  morning of Surgery.  2.  If you choose to wash your hair, wash your hair first as usual with your  normal  shampoo.  3.  After you shampoo, rinse your hair and body thoroughly to remove the  shampoo.                           4.  Use CHG as you would any other liquid soap.  You can apply chg directly  to the skin and wash                       Gently with a scrungie or clean washcloth.  5.  Apply the CHG Soap to your body ONLY FROM THE NECK DOWN.   Do not use on face/ open                           Wound or open sores. Avoid contact with eyes, ears mouth and genitals (private parts).                       Wash face,  Genitals (private parts) with your normal soap.             6.  Wash thoroughly, paying special attention to the area where your surgery  will be performed.  7.  Thoroughly rinse your body with warm water from the neck down.  8.  DO NOT shower/wash with your normal soap after using and rinsing off  the CHG Soap.                9.  Pat yourself dry with a clean towel.            10.  Wear clean pajamas.            11.  Place clean sheets on your bed the night of your first shower and do not  sleep with pets. Day of Surgery : Do not apply any lotions/deodorants the morning of surgery.  Please wear clean clothes to the hospital/surgery center.  FAILURE TO FOLLOW THESE INSTRUCTIONS MAY RESULT IN THE CANCELLATION OF YOUR SURGERY PATIENT SIGNATURE_________________________________  NURSE SIGNATURE__________________________________  ________________________________________________________________________   Adam Phenix  An incentive spirometer is a tool that can help keep your lungs clear and active. This tool measures how well you are filling your lungs with each breath. Taking long deep breaths may help reverse  or decrease the chance of developing breathing (pulmonary) problems (especially infection) following:  A long period of time when you are unable to move or be active. BEFORE THE PROCEDURE   If the spirometer includes an indicator to show your best effort, your nurse or respiratory therapist will set it to a desired goal.  If possible, sit up straight or lean slightly forward. Try not to slouch.  Hold the incentive spirometer in an upright position. INSTRUCTIONS FOR USE  1. Sit on the edge of your bed if possible, or sit up as far as you can in bed or on a chair. 2. Hold the incentive spirometer in an upright position. 3. Breathe out normally. 4. Place the mouthpiece in your mouth and seal your lips tightly around it. 5. Breathe in slowly and as deeply as possible, raising the piston or the ball toward the top of the column. 6. Hold your breath for 3-5 seconds or for as long as possible. Allow the piston or ball to fall to the bottom of the column. 7. Remove the mouthpiece from your mouth and breathe out normally. 8. Rest for a few seconds and repeat Steps 1 through 7 at least 10 times every 1-2 hours when you are awake. Take your time and take a few normal breaths between deep breaths. 9. The spirometer may include an indicator to show your best effort. Use the indicator as a goal to work toward during each repetition. 10. After each set of 10 deep breaths, practice coughing to be sure your lungs are clear. If you have an incision (the cut made at the time of surgery), support your incision when coughing by placing a  pillow or rolled up towels firmly against it. Once you are able to get out of bed, walk around indoors and cough well. You may stop using the incentive spirometer when instructed by your caregiver.  RISKS AND COMPLICATIONS  Take your time so you do not get dizzy or light-headed.  If you are in pain, you may need to take or ask for pain medication before doing incentive  spirometry. It is harder to take a deep breath if you are having pain. AFTER USE  Rest and breathe slowly and easily.  It can be helpful to keep track of a log of your progress. Your caregiver can provide you with a simple table to help with this. If you are using the spirometer at home, follow these instructions: [place] IF:   You are having difficultly using the spirometer.  You have trouble using the spirometer as often as instructed.  Your pain medication is not giving enough relief while using the spirometer.  You develop fever of 100.5 F (38.1 C) or higher. SEEK IMMEDIATE MEDICAL CARE IF:   You cough up bloody sputum that had not been present before.  You develop fever of 102 F (38.9 C) or greater.  You develop worsening pain at or near the incision site. MAKE SURE YOU:   Understand these instructions.  Will watch your condition.  Will get help right away if you are not doing well or get worse. Document Released: 09/01/2006 Document Revised: 07/14/2011 Document Reviewed: 11/02/2006 Exit[employer] Patient Information 2014 Exit[employer], [place].   ________________________________________________________________________  WHAT IS A BLOOD TRANSFUSION? Blood Transfusion Information  A transfusion is the replacement of blood or some of its parts. Blood is made up of multiple cells which provide different functions.  Red blood cells carry oxygen and are used for blood loss replacement.  White blood cells fight against infection.  Platelets control bleeding.  Plasma helps clot blood.  Other blood products are available for specialized needs, such as hemophilia or other clotting disorders. BEFORE THE TRANSFUSION  Who gives blood for transfusions?   Healthy volunteers who are fully evaluated to make sure their blood is safe. This is blood bank blood. Transfusion therapy is the safest it has ever been in the practice of medicine. Before blood is taken from a donor, a  complete history is taken to make sure that person has no history of diseases nor engages in risky social behavior (examples are intravenous drug use or sexual activity with multiple partners). The donor's travel history is screened to minimize risk of transmitting infections, such as malaria. The donated blood is tested for signs of infectious diseases, such as HIV and hepatitis. The blood is then tested to be sure it is compatible with you in order to minimize the chance of a transfusion reaction. If you or a relative donates blood, this is often done in anticipation of surgery and is not appropriate for emergency situations. It takes many days to process the donated blood. RISKS AND COMPLICATIONS Although transfusion therapy is very safe and saves many lives, the main dangers of transfusion include:   Getting an infectious disease.  Developing a transfusion reaction. This is an allergic reaction to something in the blood you were given. Every precaution is taken to prevent this. The decision to have a blood transfusion has been considered carefully by your caregiver before blood is given. Blood is not given unless the benefits outweigh the risks. AFTER THE TRANSFUSION  Right after receiving a blood transfusion, you  will usually feel much better and more energetic. This is especially true if your red blood cells have gotten low (anemic). The transfusion raises the level of the red blood cells which carry oxygen, and this usually causes an energy increase.  The nurse administering the transfusion will monitor you carefully for complications. HOME CARE INSTRUCTIONS  No special instructions are needed after a transfusion. You may find your energy is better. Speak with your caregiver about any limitations on activity for underlying diseases you may have. SEEK MEDICAL CARE IF:   Your condition is not improving after your transfusion.  You develop redness or irritation at the intravenous (IV)  site. SEEK IMMEDIATE MEDICAL CARE IF:  Any of the following symptoms occur over the next 12 hours:  Shaking chills.  You have a temperature by mouth above 102 F (38.9 C), not controlled by medicine.  Chest, back, or muscle pain.  People around you feel you are not acting correctly or are confused.  Shortness of breath or difficulty breathing.  Dizziness and fainting.  You get a rash or develop hives.  You have a decrease in urine output.  Your urine turns a dark color or changes to pink, red, or brown. Any of the following symptoms occur over the next 10 days:  You have a temperature by mouth above 102 F (38.9 C), not controlled by medicine.  Shortness of breath.  Weakness after normal activity.  The white part of the eye turns yellow (jaundice).  You have a decrease in the amount of urine or are urinating less often.  Your urine turns a dark color or changes to pink, red, or brown. Document Released: 04/18/2000 Document Revised: 07/14/2011 Document Reviewed: 12/06/2007 [facility] Patient Information 2014 [place], [place].  _______________________________________________________________________

## 2016-06-13 ENCOUNTER — Encounter (HOSPITAL_COMMUNITY)
Admission: RE | Admit: 2016-06-13 | Discharge: 2016-06-13 | Disposition: A | Discharge status: Home / Self Care | Payer: PPO | Source: Ambulatory Visit | Attending: Orthopaedic Surgery | Admitting: Orthopaedic Surgery

## 2016-06-13 ENCOUNTER — Encounter (HOSPITAL_COMMUNITY): Payer: Self-pay

## 2016-06-13 DIAGNOSIS — M1612 Unilateral primary osteoarthritis, left hip: Secondary | ICD-10-CM | POA: Diagnosis not present

## 2016-06-13 DIAGNOSIS — Z01812 Encounter for preprocedural laboratory examination: Secondary | ICD-10-CM | POA: Diagnosis not present

## 2016-06-13 LAB — CBC
HEMATOCRIT: 36.6 % (ref 36.0–46.0)
Hemoglobin: 12.3 g/dL (ref 12.0–15.0)
MCH: 31.3 pg (ref 26.0–34.0)
MCHC: 33.6 g/dL (ref 30.0–36.0)
MCV: 93.1 fL (ref 78.0–100.0)
PLATELETS: 226 10*3/uL (ref 150–400)
RBC: 3.93 MIL/uL (ref 3.87–5.11)
RDW: 14.4 % (ref 11.5–15.5)
WBC: 5.1 10*3/uL (ref 4.0–10.5)

## 2016-06-13 LAB — SURGICAL PCR SCREEN
MRSA, PCR: NEGATIVE
STAPHYLOCOCCUS AUREUS: NEGATIVE

## 2016-06-13 LAB — BASIC METABOLIC PANEL
Anion gap: 7 (ref 5–15)
BUN: 26 mg/dL — ABNORMAL HIGH (ref 6–20)
CALCIUM: 9.5 mg/dL (ref 8.9–10.3)
CHLORIDE: 108 mmol/L (ref 101–111)
CO2: 24 mmol/L (ref 22–32)
CREATININE: 0.96 mg/dL (ref 0.44–1.00)
GFR calc non Af Amer: 56 mL/min — ABNORMAL LOW (ref >60)
GLUCOSE: 95 mg/dL (ref 65–99)
Potassium: 4.1 mmol/L (ref 3.5–5.1)
Sodium: 139 mmol/L (ref 135–145)

## 2016-06-16 DIAGNOSIS — E785 Hyperlipidemia, unspecified: Secondary | ICD-10-CM | POA: Diagnosis not present

## 2016-06-16 DIAGNOSIS — M1990 Unspecified osteoarthritis, unspecified site: Secondary | ICD-10-CM | POA: Diagnosis not present

## 2016-06-16 DIAGNOSIS — E039 Hypothyroidism, unspecified: Secondary | ICD-10-CM | POA: Diagnosis not present

## 2016-06-16 DIAGNOSIS — Z5181 Encounter for therapeutic drug level monitoring: Secondary | ICD-10-CM | POA: Diagnosis not present

## 2016-06-16 DIAGNOSIS — Z0000 Encounter for general adult medical examination without abnormal findings: Secondary | ICD-10-CM | POA: Diagnosis not present

## 2016-06-16 DIAGNOSIS — M8580 Other specified disorders of bone density and structure, unspecified site: Secondary | ICD-10-CM | POA: Diagnosis not present

## 2016-06-16 DIAGNOSIS — I700 Atherosclerosis of aorta: Secondary | ICD-10-CM | POA: Diagnosis not present

## 2016-06-20 ENCOUNTER — Encounter (HOSPITAL_COMMUNITY)
Admission: RE | Disposition: A | Discharge status: Home Health | Payer: Self-pay | Source: Ambulatory Visit | Attending: Orthopaedic Surgery

## 2016-06-20 ENCOUNTER — Inpatient Hospital Stay (HOSPITAL_COMMUNITY): Payer: PPO

## 2016-06-20 ENCOUNTER — Inpatient Hospital Stay (HOSPITAL_COMMUNITY): Payer: PPO | Admitting: Certified Registered"

## 2016-06-20 ENCOUNTER — Inpatient Hospital Stay (HOSPITAL_COMMUNITY)
Admission: RE | Admit: 2016-06-20 | Discharge: 2016-06-22 | DRG: 470 | Disposition: A | Discharge status: Home Health | Payer: PPO | Source: Ambulatory Visit | Attending: Orthopaedic Surgery | Admitting: Orthopaedic Surgery

## 2016-06-20 ENCOUNTER — Encounter (HOSPITAL_COMMUNITY): Payer: Self-pay

## 2016-06-20 DIAGNOSIS — M1612 Unilateral primary osteoarthritis, left hip: Secondary | ICD-10-CM

## 2016-06-20 DIAGNOSIS — Z881 Allergy status to other antibiotic agents status: Secondary | ICD-10-CM | POA: Diagnosis not present

## 2016-06-20 DIAGNOSIS — K219 Gastro-esophageal reflux disease without esophagitis: Secondary | ICD-10-CM | POA: Diagnosis present

## 2016-06-20 DIAGNOSIS — Z86010 Personal history of colonic polyps: Secondary | ICD-10-CM | POA: Diagnosis not present

## 2016-06-20 DIAGNOSIS — Z471 Aftercare following joint replacement surgery: Secondary | ICD-10-CM | POA: Diagnosis not present

## 2016-06-20 DIAGNOSIS — Z96642 Presence of left artificial hip joint: Secondary | ICD-10-CM | POA: Diagnosis not present

## 2016-06-20 DIAGNOSIS — E039 Hypothyroidism, unspecified: Secondary | ICD-10-CM | POA: Diagnosis present

## 2016-06-20 DIAGNOSIS — Z820 Family history of epilepsy and other diseases of the nervous system: Secondary | ICD-10-CM

## 2016-06-20 DIAGNOSIS — M160 Bilateral primary osteoarthritis of hip: Principal | ICD-10-CM | POA: Diagnosis present

## 2016-06-20 DIAGNOSIS — Z87891 Personal history of nicotine dependence: Secondary | ICD-10-CM | POA: Diagnosis not present

## 2016-06-20 DIAGNOSIS — Z8619 Personal history of other infectious and parasitic diseases: Secondary | ICD-10-CM

## 2016-06-20 DIAGNOSIS — M25552 Pain in left hip: Secondary | ICD-10-CM | POA: Diagnosis not present

## 2016-06-20 DIAGNOSIS — E785 Hyperlipidemia, unspecified: Secondary | ICD-10-CM | POA: Diagnosis present

## 2016-06-20 DIAGNOSIS — R1013 Epigastric pain: Secondary | ICD-10-CM | POA: Diagnosis not present

## 2016-06-20 DIAGNOSIS — Z96641 Presence of right artificial hip joint: Secondary | ICD-10-CM | POA: Diagnosis present

## 2016-06-20 DIAGNOSIS — M1611 Unilateral primary osteoarthritis, right hip: Secondary | ICD-10-CM | POA: Diagnosis not present

## 2016-06-20 DIAGNOSIS — M25559 Pain in unspecified hip: Secondary | ICD-10-CM

## 2016-06-20 HISTORY — PX: TOTAL HIP ARTHROPLASTY: SHX124

## 2016-06-20 LAB — TYPE AND SCREEN
ABO/RH(D): O POS
ANTIBODY SCREEN: NEGATIVE

## 2016-06-20 SURGERY — ARTHROPLASTY, HIP, TOTAL, ANTERIOR APPROACH
Anesthesia: Spinal | Site: Hip | Laterality: Left

## 2016-06-20 MED ORDER — ALUM & MAG HYDROXIDE-SIMETH 200-200-20 MG/5ML PO SUSP: 30.0000 mL | ORAL | Status: DC | PRN

## 2016-06-20 MED ORDER — CELECOXIB 200 MG PO CAPS
200.0000 mg | ORAL_CAPSULE | Freq: Two times a day (BID) | ORAL | Status: DC
  Administered 2016-06-20 – 2016-06-22 (×4): 200 mg via ORAL
  Filled 2016-06-20 (×4): qty 1

## 2016-06-20 MED ORDER — FENTANYL CITRATE (PF) 100 MCG/2ML IJ SOLN
INTRAMUSCULAR | Status: AC
  Filled 2016-06-20: qty 2

## 2016-06-20 MED ORDER — PHENYLEPHRINE HCL 10 MG/ML IJ SOLN
INTRAVENOUS | Status: DC | PRN
  Administered 2016-06-20: 25 ug/min via INTRAVENOUS

## 2016-06-20 MED ORDER — HYDROMORPHONE HCL 1 MG/ML IJ SOLN
0.5000 mg | INTRAMUSCULAR | Status: DC | PRN
  Administered 2016-06-20: 16:00:00 0.5 mg via INTRAVENOUS
  Filled 2016-06-20: qty 0.5

## 2016-06-20 MED ORDER — ONDANSETRON HCL 4 MG/2ML IJ SOLN: 4.0000 mg | Freq: Four times a day (QID) | INTRAMUSCULAR | Status: DC | PRN

## 2016-06-20 MED ORDER — ONDANSETRON HCL 4 MG/2ML IJ SOLN
INTRAMUSCULAR | Status: AC
  Filled 2016-06-20: qty 2

## 2016-06-20 MED ORDER — PHENOL 1.4 % MT LIQD
1.0000 | OROMUCOSAL | Status: DC | PRN
  Filled 2016-06-20: qty 177

## 2016-06-20 MED ORDER — ACETAMINOPHEN 650 MG RE SUPP
650.0000 mg | Freq: Four times a day (QID) | RECTAL | Status: DC | PRN
Start: 2016-06-20 — End: 2016-06-22

## 2016-06-20 MED ORDER — FENTANYL CITRATE (PF) 100 MCG/2ML IJ SOLN
25.0000 ug | INTRAMUSCULAR | Status: DC | PRN
  Administered 2016-06-20: 25 ug via INTRAVENOUS
  Administered 2016-06-20: 50 ug via INTRAVENOUS
  Administered 2016-06-20: 25 ug via INTRAVENOUS
  Administered 2016-06-20: 50 ug via INTRAVENOUS

## 2016-06-20 MED ORDER — METHOCARBAMOL 500 MG PO TABS
500.0000 mg | ORAL_TABLET | Freq: Four times a day (QID) | ORAL | Status: DC | PRN
  Administered 2016-06-21 (×3): 500 mg via ORAL
  Filled 2016-06-20 (×3): qty 1

## 2016-06-20 MED ORDER — ONDANSETRON HCL 4 MG PO TABS: 4.0000 mg | ORAL_TABLET | Freq: Four times a day (QID) | ORAL | Status: DC | PRN

## 2016-06-20 MED ORDER — SODIUM CHLORIDE 0.9 % IR SOLN
Status: DC | PRN
  Administered 2016-06-20: 1000 mL

## 2016-06-20 MED ORDER — LEVOTHYROXINE SODIUM 88 MCG PO TABS
88.0000 ug | ORAL_TABLET | Freq: Every day | ORAL | Status: DC
  Administered 2016-06-21 – 2016-06-22 (×2): 88 ug via ORAL
  Filled 2016-06-20 (×2): qty 1

## 2016-06-20 MED ORDER — PROPOFOL 10 MG/ML IV BOLUS
INTRAVENOUS | Status: AC
  Filled 2016-06-20: qty 20

## 2016-06-20 MED ORDER — METOCLOPRAMIDE HCL 5 MG/ML IJ SOLN: 5.0000 mg | Freq: Three times a day (TID) | INTRAMUSCULAR | Status: DC | PRN

## 2016-06-20 MED ORDER — PANTOPRAZOLE SODIUM 40 MG PO TBEC
40.0000 mg | DELAYED_RELEASE_TABLET | Freq: Every day | ORAL | Status: DC
  Administered 2016-06-21 – 2016-06-22 (×2): 40 mg via ORAL
  Filled 2016-06-20 (×2): qty 1

## 2016-06-20 MED ORDER — ONDANSETRON HCL 4 MG/2ML IJ SOLN
INTRAMUSCULAR | Status: DC | PRN
  Administered 2016-06-20: 4 mg via INTRAVENOUS

## 2016-06-20 MED ORDER — DIPHENHYDRAMINE HCL 12.5 MG/5ML PO ELIX: 12.5000 mg | ORAL_SOLUTION | ORAL | Status: DC | PRN

## 2016-06-20 MED ORDER — ALBUMIN HUMAN 5 % IV SOLN
INTRAVENOUS | Status: DC | PRN
  Administered 2016-06-20 (×2): via INTRAVENOUS

## 2016-06-20 MED ORDER — METHOCARBAMOL 1000 MG/10ML IJ SOLN
500.0000 mg | Freq: Four times a day (QID) | INTRAVENOUS | Status: DC | PRN
  Administered 2016-06-20: 500 mg via INTRAVENOUS
  Filled 2016-06-20: qty 550
  Filled 2016-06-20: qty 5

## 2016-06-20 MED ORDER — PROPOFOL 500 MG/50ML IV EMUL
INTRAVENOUS | Status: DC | PRN
  Administered 2016-06-20: 75 ug/kg/min via INTRAVENOUS

## 2016-06-20 MED ORDER — FENTANYL CITRATE (PF) 100 MCG/2ML IJ SOLN
INTRAMUSCULAR | Status: DC | PRN
  Administered 2016-06-20 (×2): 50 ug via INTRAVENOUS

## 2016-06-20 MED ORDER — CHLORHEXIDINE GLUCONATE 4 % EX LIQD: 60.0000 mL | Freq: Once | CUTANEOUS | Status: DC

## 2016-06-20 MED ORDER — LACTATED RINGERS IV SOLN
INTRAVENOUS | Status: DC
  Administered 2016-06-20: 12:00:00 via INTRAVENOUS
  Administered 2016-06-20: 1000 mL via INTRAVENOUS

## 2016-06-20 MED ORDER — METOCLOPRAMIDE HCL 5 MG PO TABS: 5.0000 mg | ORAL_TABLET | Freq: Three times a day (TID) | ORAL | Status: DC | PRN

## 2016-06-20 MED ORDER — PROPOFOL 10 MG/ML IV BOLUS
INTRAVENOUS | Status: DC | PRN
  Administered 2016-06-20 (×3): 20 mg via INTRAVENOUS

## 2016-06-20 MED ORDER — POLYETHYLENE GLYCOL 3350 17 G PO PACK
17.0000 g | PACK | Freq: Every day | ORAL | Status: DC | PRN
  Administered 2016-06-22: 17 g via ORAL
  Filled 2016-06-20: qty 1

## 2016-06-20 MED ORDER — LIDOCAINE 2% (20 MG/ML) 5 ML SYRINGE
INTRAMUSCULAR | Status: DC | PRN
  Administered 2016-06-20: 40 mg via INTRAVENOUS

## 2016-06-20 MED ORDER — DEXAMETHASONE SODIUM PHOSPHATE 10 MG/ML IJ SOLN
INTRAMUSCULAR | Status: DC | PRN
  Administered 2016-06-20: 10 mg via INTRAVENOUS

## 2016-06-20 MED ORDER — ADULT MULTIVITAMIN W/MINERALS CH
1.0000 | ORAL_TABLET | Freq: Every evening | ORAL | Status: DC
  Administered 2016-06-20 – 2016-06-21 (×2): 1 via ORAL
  Filled 2016-06-20 (×2): qty 1

## 2016-06-20 MED ORDER — CONJ ESTROG-MEDROXYPROGEST ACE 0.3-1.5 MG PO TABS: 1.0000 | ORAL_TABLET | Freq: Every evening | ORAL | Status: DC

## 2016-06-20 MED ORDER — SODIUM CHLORIDE 0.9 % IV SOLN
INTRAVENOUS | Status: DC
Start: 2016-06-20 — End: 2016-06-21
  Administered 2016-06-20 – 2016-06-21 (×2): via INTRAVENOUS

## 2016-06-20 MED ORDER — LIDOCAINE 2% (20 MG/ML) 5 ML SYRINGE
INTRAMUSCULAR | Status: AC
  Filled 2016-06-20: qty 5

## 2016-06-20 MED ORDER — DEXAMETHASONE SODIUM PHOSPHATE 10 MG/ML IJ SOLN
INTRAMUSCULAR | Status: AC
  Filled 2016-06-20: qty 1

## 2016-06-20 MED ORDER — PHENYLEPHRINE 40 MCG/ML (10ML) SYRINGE FOR IV PUSH (FOR BLOOD PRESSURE SUPPORT)
PREFILLED_SYRINGE | INTRAVENOUS | Status: DC | PRN
  Administered 2016-06-20 (×2): 80 ug via INTRAVENOUS
  Administered 2016-06-20: 120 ug via INTRAVENOUS

## 2016-06-20 MED ORDER — PHENYLEPHRINE 40 MCG/ML (10ML) SYRINGE FOR IV PUSH (FOR BLOOD PRESSURE SUPPORT)
PREFILLED_SYRINGE | INTRAVENOUS | Status: AC
  Filled 2016-06-20: qty 10

## 2016-06-20 MED ORDER — CEFAZOLIN SODIUM-DEXTROSE 2-4 GM/100ML-% IV SOLN
INTRAVENOUS | Status: AC
  Filled 2016-06-20: qty 100

## 2016-06-20 MED ORDER — CEFAZOLIN SODIUM-DEXTROSE 2-4 GM/100ML-% IV SOLN
2.0000 g | INTRAVENOUS | Status: AC
  Administered 2016-06-20: 2 g via INTRAVENOUS
  Filled 2016-06-20: qty 100

## 2016-06-20 MED ORDER — BUPIVACAINE IN DEXTROSE 0.75-8.25 % IT SOLN
INTRATHECAL | Status: DC | PRN
  Administered 2016-06-20: 1.8 mL via INTRATHECAL

## 2016-06-20 MED ORDER — CALCIUM CARBONATE-VITAMIN D 500-200 MG-UNIT PO TABS
1.0000 | ORAL_TABLET | Freq: Every day | ORAL | Status: DC
  Administered 2016-06-21 – 2016-06-22 (×2): 1 via ORAL
  Filled 2016-06-20 (×2): qty 1

## 2016-06-20 MED ORDER — ZOLPIDEM TARTRATE 5 MG PO TABS: 5.0000 mg | ORAL_TABLET | Freq: Every evening | ORAL | Status: DC | PRN

## 2016-06-20 MED ORDER — OXYCODONE HCL 5 MG PO TABS
5.0000 mg | ORAL_TABLET | ORAL | Status: DC | PRN
  Administered 2016-06-20: 22:00:00 10 mg via ORAL
  Administered 2016-06-20: 5 mg via ORAL
  Administered 2016-06-21 – 2016-06-22 (×8): 10 mg via ORAL
  Filled 2016-06-20: qty 2
  Filled 2016-06-20: qty 1
  Filled 2016-06-20 (×3): qty 2
  Filled 2016-06-20: qty 1
  Filled 2016-06-20 (×2): qty 2
  Filled 2016-06-20: qty 1
  Filled 2016-06-20 (×2): qty 2

## 2016-06-20 MED ORDER — DOCUSATE SODIUM 100 MG PO CAPS
100.0000 mg | ORAL_CAPSULE | Freq: Two times a day (BID) | ORAL | Status: DC
Start: 2016-06-20 — End: 2016-06-22
  Administered 2016-06-20 – 2016-06-22 (×4): 100 mg via ORAL
  Filled 2016-06-20 (×4): qty 1

## 2016-06-20 MED ORDER — ACETAMINOPHEN 325 MG PO TABS: 650.0000 mg | ORAL_TABLET | Freq: Four times a day (QID) | ORAL | Status: DC | PRN

## 2016-06-20 MED ORDER — MIDAZOLAM HCL 2 MG/2ML IJ SOLN
INTRAMUSCULAR | Status: AC
  Filled 2016-06-20: qty 2

## 2016-06-20 MED ORDER — ASPIRIN 81 MG PO CHEW
81.0000 mg | CHEWABLE_TABLET | Freq: Two times a day (BID) | ORAL | Status: DC
  Administered 2016-06-20 – 2016-06-22 (×4): 81 mg via ORAL
  Filled 2016-06-20 (×4): qty 1

## 2016-06-20 MED ORDER — STERILE WATER FOR IRRIGATION IR SOLN
Status: DC | PRN
  Administered 2016-06-20: 2000 mL

## 2016-06-20 MED ORDER — PRAVASTATIN SODIUM 20 MG PO TABS
40.0000 mg | ORAL_TABLET | Freq: Every evening | ORAL | Status: DC
  Administered 2016-06-20 – 2016-06-21 (×2): 40 mg via ORAL
  Filled 2016-06-20 (×2): qty 2

## 2016-06-20 MED ORDER — MIDAZOLAM HCL 2 MG/2ML IJ SOLN
INTRAMUSCULAR | Status: DC | PRN
  Administered 2016-06-20: 2 mg via INTRAVENOUS

## 2016-06-20 MED ORDER — CEFAZOLIN IN D5W 1 GM/50ML IV SOLN
1.0000 g | Freq: Four times a day (QID) | INTRAVENOUS | Status: AC
  Administered 2016-06-20 – 2016-06-21 (×2): 1 g via INTRAVENOUS
  Filled 2016-06-20 (×2): qty 50

## 2016-06-20 MED ORDER — TRANEXAMIC ACID 1000 MG/10ML IV SOLN
1000.0000 mg | INTRAVENOUS | Status: AC
  Administered 2016-06-20: 1000 mg via INTRAVENOUS
  Filled 2016-06-20: qty 1100

## 2016-06-20 MED ORDER — VITAMIN D3 25 MCG (1000 UNIT) PO TABS
5000.0000 [IU] | ORAL_TABLET | Freq: Every day | ORAL | Status: DC
  Administered 2016-06-20 – 2016-06-22 (×3): 5000 [IU] via ORAL
  Filled 2016-06-20 (×3): qty 5

## 2016-06-20 MED ORDER — MENTHOL 3 MG MT LOZG: 1.0000 | LOZENGE | OROMUCOSAL | Status: DC | PRN

## 2016-06-20 SURGICAL SUPPLY — 41 items
BAG ZIPLOCK 12X15 (MISCELLANEOUS) IMPLANT
BENZOIN TINCTURE PRP APPL 2/3 (GAUZE/BANDAGES/DRESSINGS) ×3 IMPLANT
BLADE SAW SGTL 18X1.27X75 (BLADE) ×2 IMPLANT
BLADE SAW SGTL 18X1.27X75MM (BLADE) ×1
CAPT HIP TOTAL 2 ×3 IMPLANT
CELLS DAT CNTRL 66122 CELL SVR (MISCELLANEOUS) ×1 IMPLANT
CLOSURE WOUND 1/2 X4 (GAUZE/BANDAGES/DRESSINGS) ×1
CLOTH BEACON ORANGE TIMEOUT ST (SAFETY) ×3 IMPLANT
COVER PERINEAL POST (MISCELLANEOUS) ×3 IMPLANT
DRAPE STERI IOBAN 125X83 (DRAPES) ×3 IMPLANT
DRAPE U-SHAPE 47X51 STRL (DRAPES) ×6 IMPLANT
DRSG AQUACEL AG ADV 3.5X10 (GAUZE/BANDAGES/DRESSINGS) ×3 IMPLANT
DURAPREP 26ML APPLICATOR (WOUND CARE) ×3 IMPLANT
ELECT REM PT RETURN 9FT ADLT (ELECTROSURGICAL) ×3
ELECTRODE REM PT RTRN 9FT ADLT (ELECTROSURGICAL) ×1 IMPLANT
GAUZE XEROFORM 1X8 LF (GAUZE/BANDAGES/DRESSINGS) IMPLANT
GLOVE BIO SURGEON STRL SZ7.5 (GLOVE) ×3 IMPLANT
GLOVE BIOGEL PI IND STRL 7.5 (GLOVE) ×1 IMPLANT
GLOVE BIOGEL PI IND STRL 8 (GLOVE) ×2 IMPLANT
GLOVE BIOGEL PI INDICATOR 7.5 (GLOVE) ×2
GLOVE BIOGEL PI INDICATOR 8 (GLOVE) ×4
GLOVE ECLIPSE 8.0 STRL XLNG CF (GLOVE) ×3 IMPLANT
GOWN STRL REUS W/TWL XL LVL3 (GOWN DISPOSABLE) ×6 IMPLANT
HANDPIECE INTERPULSE COAX TIP (DISPOSABLE) ×2
HEAD CERAMIC DELTA 36 PLUS 1.5 (Hips) ×3 IMPLANT
HOLDER FOLEY CATH W/STRAP (MISCELLANEOUS) ×3 IMPLANT
PACK ANTERIOR HIP CUSTOM (KITS) ×3 IMPLANT
RTRCTR WOUND ALEXIS 18CM MED (MISCELLANEOUS) ×3
SET HNDPC FAN SPRY TIP SCT (DISPOSABLE) ×1 IMPLANT
STAPLER VISISTAT 35W (STAPLE) IMPLANT
STEM CORAIL KA10 (Stem) ×3 IMPLANT
STRIP CLOSURE SKIN 1/2X4 (GAUZE/BANDAGES/DRESSINGS) ×2 IMPLANT
SUT ETHIBOND NAB CT1 #1 30IN (SUTURE) ×3 IMPLANT
SUT MNCRL AB 4-0 PS2 18 (SUTURE) IMPLANT
SUT VIC AB 0 CT1 36 (SUTURE) ×3 IMPLANT
SUT VIC AB 1 CT1 36 (SUTURE) ×3 IMPLANT
SUT VIC AB 2-0 CT1 27 (SUTURE) ×4
SUT VIC AB 2-0 CT1 TAPERPNT 27 (SUTURE) ×2 IMPLANT
TRAY FOLEY CATH 14FRSI W/METER (CATHETERS) ×3 IMPLANT
TRAY FOLEY W/METER SILVER 16FR (SET/KITS/TRAYS/PACK) IMPLANT
YANKAUER SUCT BULB TIP 10FT TU (MISCELLANEOUS) ×3 IMPLANT

## 2016-06-20 NOTE — H&P (Signed)
TOTAL HIP ADMISSION H&P  Patient is admitted for left total hip arthroplasty.  Subjective:  Chief Complaint: left hip pain  HPI: Kimberly Johns, 76 y.o. female, has a history of pain and functional disability in the left hip(s) due to arthritis and patient has failed non-surgical conservative treatments for greater than 12 weeks to include NSAID's and/or analgesics, corticosteriod injections, flexibility and strengthening excercises, use of assistive devices and activity modification.  Onset of symptoms was gradual starting 2 years ago with gradually worsening course since that time.The patient noted no past surgery on the left hip(s).  Patient currently rates pain in the left hip at 10 out of 10 with activity. Patient has night pain, worsening of pain with activity and weight bearing, pain that interfers with activities of daily living and pain with passive range of motion. Patient has evidence of subchondral sclerosis, periarticular osteophytes and joint space narrowing by imaging studies. This condition presents safety issues increasing the risk of falls.  There is no current active infection.  Patient Active Problem List   Diagnosis Date Noted  . Unilateral primary osteoarthritis, left hip 06/20/2016  . Osteoarthritis of right hip 12/08/2014  . Status post total replacement of right hip 12/08/2014  . Abdominal pain, epigastric 07/21/2012  . Esophageal reflux 07/21/2012  . Personal history of colonic polyps 07/21/2012   Past Medical History:  Diagnosis Date  . Arthritis   . Colon polyps   . Diverticulosis   . Fracture of ankle, trimalleolar, left, closed   . Gallbladder disease   . GERD (gastroesophageal reflux disease)   . History of chicken pox   . HLD (hyperlipidemia)   . Hypothyroidism   . Kidney cysts   . Liver cyst   . Measles    hx of   . Mumps    hx of   . Shingles    hx of     Past Surgical History:  Procedure Laterality Date  . ANKLE FRACTURE SURGERY Left 2009   . ANTERIOR CRUCIATE LIGAMENT REPAIR Left 2001  . APPENDECTOMY  2010  . COLONOSCOPY WITH PROPOFOL N/A 10/30/2014   Procedure: COLONOSCOPY WITH PROPOFOL;  Surgeon: Charolett Bumpers, MD;  Location: [facility];  Service: Endoscopy;  Laterality: N/A;  . TONSILLECTOMY     age 65  . TONSILLECTOMY    . TOTAL HIP ARTHROPLASTY Right 12/08/2014   Procedure: RIGHT TOTAL HIP ARTHROPLASTY ANTERIOR APPROACH;  Surgeon: Kathryne Hitch, MD;  Location: [facility];  Service: Orthopedics;  Laterality: Right;    No prescriptions prior to admission.   Allergies  Allergen Reactions  . Vancomycin Rash    Social History  Substance Use Topics  . Smoking status: Former Smoker    Packs/day: 0.50    Years: 6.00    Types: Cigarettes    Quit date: 10/22/1968  . Smokeless tobacco: Never Used  . Alcohol use No    Family History  Problem Relation Age of Onset  . Alzheimer's disease Mother 37  . Colon cancer Neg Hx   . Esophageal cancer Neg Hx   . Rectal cancer Neg Hx   . Stomach cancer Neg Hx      Review of Systems  Musculoskeletal: Positive for joint pain.  All other systems reviewed and are negative.   Objective:  Physical Exam  Constitutional: She is oriented to person, place, and time. She appears well-developed and well-nourished.  HENT:  Head: Normocephalic and atraumatic.  Eyes: EOM are normal. Pupils are equal, round, and reactive  to light.  Neck: Normal range of motion. Neck supple.  Cardiovascular: Normal rate and regular rhythm.   Respiratory: Effort normal and breath sounds normal.  GI: Soft.  Musculoskeletal:       Left hip: She exhibits decreased range of motion, decreased strength, tenderness and bony tenderness.  Neurological: She is alert and oriented to person, place, and time.  Skin: Skin is warm and dry.  Psychiatric: She has a normal mood and affect.    Vital signs in last 24 hours:    Labs:   Estimated body mass index is 24.86 kg/m as calculated from the  following:   Height as of 06/13/16: 5\' 5"  (1.651 m).   Weight as of 06/13/16: 149 lb 6.4 oz (67.8 kg).   Imaging Review Plain radiographs demonstrate severe degenerative joint disease of the left hip(s). The bone quality appears to be good for age and reported activity level.  Assessment/Plan:  End stage arthritis, left hip(s)  The patient history, physical examination, clinical judgement of the provider and imaging studies are consistent with end stage degenerative joint disease of the left hip(s) and total hip arthroplasty is deemed medically necessary. The treatment options including medical management, injection therapy, arthroscopy and arthroplasty were discussed at length. The risks and benefits of total hip arthroplasty were presented and reviewed. The risks due to aseptic loosening, infection, stiffness, dislocation/subluxation,  thromboembolic complications and other imponderables were discussed.  The patient acknowledged the explanation, agreed to proceed with the plan and consent was signed. Patient is being admitted for inpatient treatment for surgery, pain control, PT, OT, prophylactic antibiotics, VTE prophylaxis, progressive ambulation and ADL's and discharge planning.The patient is planning to be discharged home with home health services

## 2016-06-20 NOTE — Brief Op Note (Signed)
06/20/2016  1:48 PM  PATIENT:  Terese DoorGail E Schurman  76 y.o. female  PRE-OPERATIVE DIAGNOSIS:  osteoarthritis left hip  POST-OPERATIVE DIAGNOSIS:  osteoarthritis left hip  PROCEDURE:  Procedure(s): LEFT TOTAL HIP ARTHROPLASTY ANTERIOR APPROACH (Left)  SURGEON:  Surgeon(s) and Role:    * Kathryne Hitchhristopher Y Blackman, MD - Primary  PHYSICIAN ASSISTANT: Rexene EdisonGil Clark, PA-C  ANESTHESIA:   spinal  EBL:  Total I/O In: 1250 [I.V.:1000; IV Piggyback:250] Out: 650 [Urine:250; Blood:400]  COUNTS:  YES  DICTATION: .Other Dictation: Dictation Number [id number]  PLAN OF CARE: Admit to inpatient   PATIENT DISPOSITION:  PACU - hemodynamically stable.   Delay start of Pharmacological VTE agent (>24hrs) due to surgical blood loss or risk of bleeding: no

## 2016-06-20 NOTE — Anesthesia Procedure Notes (Signed)
Spinal  Patient location during procedure: OR End time: 06/20/2016 12:29 PM Staffing Resident/CRNA: Minerva EndsMIRARCHI, Aslyn Cottman M Performed: resident/CRNA  Preanesthetic Checklist Completed: patient identified, site marked, surgical consent, pre-op evaluation, timeout performed, IV checked, risks and benefits discussed and monitors and equipment checked Spinal Block Patient position: sitting Prep: site prepped and draped, DuraPrep and dry at puncture Patient monitoring: heart rate, continuous pulse ox, blood pressure and cardiac monitor Approach: midline Location: L2-3 Injection technique: single-shot Needle Needle type: Sprotte  Needle gauge: 24 G Assessment Sensory level: T4 Additional Notes Expiration date of tray noted and within date.   Patient tolerated procedure well.  Jean RosenthalJackson physically present- supervised procedure

## 2016-06-20 NOTE — Anesthesia Preprocedure Evaluation (Addendum)
Anesthesia Evaluation  Patient identified by MRN, date of birth, ID band Patient awake    Reviewed: Allergy & Precautions, H&P , NPO status , Patient's Chart, lab work & pertinent test results, reviewed documented beta blocker date and time   Airway Mallampati: II  TM Distance: >3 FB Neck ROM: full    Dental no notable dental hx. (+) Dental Advisory Given   Pulmonary former smoker,    Pulmonary exam normal breath sounds clear to auscultation       Cardiovascular  Rhythm:regular Rate:Normal     Neuro/Psych    GI/Hepatic GERD  Controlled,  Endo/Other  Hypothyroidism   Renal/GU      Musculoskeletal  (+) Arthritis ,   Abdominal   Peds  Hematology   Anesthesia Other Findings   Reproductive/Obstetrics                          Anesthesia Physical Anesthesia Plan  ASA: II  Anesthesia Plan: Spinal   Post-op Pain Management:    Induction:   Airway Management Planned: Simple Face Mask  Additional Equipment:   Intra-op Plan:   Post-operative Plan:   Informed Consent: I have reviewed the patients History and Physical, chart, labs and discussed the procedure including the risks, benefits and alternatives for the proposed anesthesia with the patient or authorized representative who has indicated his/her understanding and acceptance.   Dental advisory given  Plan Discussed with: CRNA and Surgeon  Anesthesia Plan Comments:        Anesthesia Quick Evaluation

## 2016-06-20 NOTE — Anesthesia Postprocedure Evaluation (Signed)
Anesthesia Post Note  Patient: Kimberly Johns  Procedure(s) Performed: Procedure(s) (LRB): LEFT TOTAL HIP ARTHROPLASTY ANTERIOR APPROACH (Left)  Patient location during evaluation: PACU Anesthesia Type: Spinal Level of consciousness: awake Pain management: satisfactory to patient Vital Signs Assessment: post-procedure vital signs reviewed and stable Respiratory status: spontaneous breathing Cardiovascular status: blood pressure returned to baseline Postop Assessment: no headache and spinal receding Anesthetic complications: no       Last Vitals:  Vitals:   06/20/16 1515 06/20/16 1529  BP: 128/74 (!) 134/53  Pulse: 73 62  Resp: 13 14  Temp: 36.4 C 36.4 C    Last Pain:  Vitals:   06/20/16 1604  TempSrc:   PainSc: 6                  Salvador Bigbee EDWARD

## 2016-06-20 NOTE — Transfer of Care (Signed)
Immediate Anesthesia Transfer of Care Note  Patient: Kimberly Johns  Procedure(s) Performed: Procedure(s): LEFT TOTAL HIP ARTHROPLASTY ANTERIOR APPROACH (Left)  Patient Location: PACU  Anesthesia Type:Spinal  Level of Consciousness: awake and alert   Airway & Oxygen Therapy: Patient Spontanous Breathing and Patient connected to face mask oxygen  Post-op Assessment: Report given to RN and Post -op Vital signs reviewed and stable  Post vital signs: stable on Neo gtt  Last Vitals:  Vitals:   06/20/16 0951 06/20/16 1412  BP: 134/76   Pulse: 84   Resp: 16   Temp: 36.4 C (P) 36.4 C    Last Pain:  Vitals:   06/20/16 0951  TempSrc: Oral      Patients Stated Pain Goal: 4 (06/20/16 1157)  Complications: No apparent anesthesia complications

## 2016-06-21 LAB — CBC
HCT: 26.6 % — ABNORMAL LOW (ref 36.0–46.0)
Hemoglobin: 9.2 g/dL — ABNORMAL LOW (ref 12.0–15.0)
MCH: 32.1 pg (ref 26.0–34.0)
MCHC: 34.6 g/dL (ref 30.0–36.0)
MCV: 92.7 fL (ref 78.0–100.0)
PLATELETS: 185 10*3/uL (ref 150–400)
RBC: 2.87 MIL/uL — ABNORMAL LOW (ref 3.87–5.11)
RDW: 14.4 % (ref 11.5–15.5)
WBC: 6.5 10*3/uL (ref 4.0–10.5)

## 2016-06-21 LAB — BASIC METABOLIC PANEL
Anion gap: 6 (ref 5–15)
BUN: 16 mg/dL (ref 6–20)
CALCIUM: 8.6 mg/dL — AB (ref 8.9–10.3)
CO2: 24 mmol/L (ref 22–32)
Chloride: 111 mmol/L (ref 101–111)
Creatinine, Ser: 0.77 mg/dL (ref 0.44–1.00)
GFR calc Af Amer: >60 mL/min (ref >60)
GLUCOSE: 141 mg/dL — AB (ref 65–99)
Potassium: 4 mmol/L (ref 3.5–5.1)
Sodium: 141 mmol/L (ref 135–145)

## 2016-06-21 MED ORDER — METHOCARBAMOL 500 MG PO TABS: 500.0000 mg | ORAL_TABLET | Freq: Four times a day (QID) | ORAL | 0 refills | Status: DC | PRN

## 2016-06-21 MED ORDER — DOXYCYCLINE HYCLATE 100 MG PO TABS: 100.0000 mg | ORAL_TABLET | Freq: Two times a day (BID) | ORAL | 0 refills | Status: DC

## 2016-06-21 MED ORDER — OXYCODONE-ACETAMINOPHEN 5-325 MG PO TABS: 1.0000 | ORAL_TABLET | ORAL | 0 refills | Status: DC | PRN

## 2016-06-21 MED ORDER — ASPIRIN 81 MG PO CHEW: 81.0000 mg | CHEWABLE_TABLET | Freq: Two times a day (BID) | ORAL | 0 refills | Status: DC

## 2016-06-21 NOTE — Evaluation (Signed)
Physical Therapy Evaluation Patient Details Name: Kimberly Johns MRN: [id number] DOB: Oct 19, 1940 Today's Date: 06/21/2016   History of Present Illness  76 y.o. female now s/p Lt anterior THA. PMH: Rt THA, Lt ankle fx.   Clinical Impression  Pt is s/p anterior THA resulting in the deficits listed below (see PT Problem List). Pt ambulating 65 ft with rw and min guard assist. Pt state that she will have her brother to assist her at home and friends when he has to leave. Anticipating that the pt will D/C to home with family and friends to assist.  Pt will benefit from skilled PT to increase their independence and safety with mobility.       Follow Up Recommendations Home health PT;Supervision for mobility/OOB    Equipment Recommendations  None recommended by PT    Recommendations for Other Services       Precautions / Restrictions Precautions Precautions: Fall Precaution Comments: HEP provided Restrictions Weight Bearing Restrictions: Yes LLE Weight Bearing: Weight bearing as tolerated      Mobility  Bed Mobility Overal bed mobility: Needs Assistance Bed Mobility: Supine to Sit     Supine to sit: Min guard     General bed mobility comments: guard for safety provided  Transfers Overall transfer level: Needs assistance Equipment used: Rolling walker (2 wheeled) Transfers: Sit to/from Stand Sit to Stand: Min guard         General transfer comment: cues for hand placement  Ambulation/Gait Ambulation/Gait assistance: Min guard Ambulation Distance (Feet): 65 Feet Assistive device: Rolling walker (2 wheeled) Gait Pattern/deviations: Step-to pattern;Step-through pattern Gait velocity: decreased   General Gait Details: progressing to step through pattern.  Stairs            Wheelchair Mobility    Modified Rankin (Stroke Patients Only)       Balance Overall balance assessment: Needs assistance Sitting-balance support: No upper extremity  supported Sitting balance-Leahy Scale: Good     Standing balance support: Bilateral upper extremity supported Standing balance-Leahy Scale: Poor Standing balance comment: using rw for support                             Pertinent Vitals/Pain Pain Assessment: 0-10 Pain Score: 4  Pain Location: Lt hip Pain Descriptors / Indicators: Sore Pain Intervention(s): Limited activity within patient's tolerance;Monitored during session;Ice applied    Home Living Family/patient expects to be discharged to:: Private residence Living Arrangements: Alone Available Help at Discharge: Family;Friend(s);Available 24 hours/day Type of Home: House Home Access: Stairs to enter Entrance Stairs-Rails: Right Entrance Stairs-Number of Steps: 3 Home Layout: Two level Home Equipment: Walker - 2 wheels;Walker - 4 wheels;Cane - single point;Bedside commode Additional Comments: will have brother and friends assisting at home    Prior Function Level of Independence: Independent               Hand Dominance        Extremity/Trunk Assessment   Upper Extremity Assessment Upper Extremity Assessment: Overall WFL for tasks assessed    Lower Extremity Assessment Lower Extremity Assessment: LLE deficits/detail LLE Deficits / Details: able to move LLE independently with bed mobility    Cervical / Trunk Assessment Cervical / Trunk Assessment: Normal  Communication   Communication: No difficulties  Cognition Arousal/Alertness: Awake/alert Behavior During Therapy: WFL for tasks assessed/performed Overall Cognitive Status: Within Functional Limits for tasks assessed  General Comments      Exercises     Assessment/Plan    PT Assessment Patient needs continued PT services  PT Problem List Decreased strength;Decreased range of motion;Decreased activity tolerance;Decreased balance;Decreased mobility          PT Treatment Interventions DME  instruction;Gait training;Stair training;Functional mobility training;Therapeutic activities;Therapeutic exercise;Patient/family education    PT Goals (Current goals can be found in the Care Plan section)  Acute Rehab PT Goals Patient Stated Goal: get back to skiing PT Goal Formulation: With patient Time For Goal Achievement: 07/05/16 Potential to Achieve Goals: Good    Frequency 7X/week   Barriers to discharge        Co-evaluation               End of Session Equipment Utilized During Treatment: Gait belt Activity Tolerance: Patient tolerated treatment well Patient left: in chair;with call bell/phone within reach Nurse Communication: Mobility status         Time: [id number] PT Time Calculation (min) (ACUTE ONLY): 26 min   Charges:   PT Evaluation $PT Eval Moderate Complexity: 1 Procedure PT Treatments $Gait Training: 8-22 mins   PT G Codes:        Christiane HaBenjamin J. Janine Reller, PT, CSCS Pager [phone] Office [phone]  06/21/2016, 9:08 AM

## 2016-06-21 NOTE — Care Management Note (Signed)
Case Management Note  Patient Details  Name: Kimberly Johns MRN: [id number] Date of Birth: 1940-10-08  Subjective/Objective:   Left THA                 Action/Plan: Discharge Planning: AVS reviewed: [facility]. States she has wheelchair, RW, cane, and elevated toilet seat at home. States brother will be at home to assist with care.  PCP Shirlean MylarWEBB, CAROL MD  Expected Discharge Date:  06/22/2016                Expected Discharge Plan:  Home w Home Health Services  In-House Referral:  NA  Discharge planning Services  CM Consult  Post Acute Care Choice:  Home Health Choice offered to:  Patient  DME Arranged:  N/A DME Agency:  NA  HH Arranged:  PT HH Agency:  [facility]  Status of Service:  Completed, signed off  If discussed at [employer] Length of Stay Meetings, dates discussed:    Additional Comments:  Elliot CousinShavis, Arianah Torgeson Ellen, RN 06/21/2016, 10:43 AM

## 2016-06-21 NOTE — Discharge Instructions (Signed)

## 2016-06-21 NOTE — Progress Notes (Addendum)
Subjective: Patient doing well.  This hip has been easier for her than her right hip replacement  Objective: Vital signs in last 24 hours: Temp:  [97.5 F (36.4 C)-98.3 F (36.8 C)] 98.3 F (36.8 C) (02/17 1106) Pulse Rate:  [59-74] 70 (02/17 1106) Resp:  [13-17] 14 (02/17 1106) BP: (98-152)/(48-97) 110/55 (02/17 1106) SpO2:  [97 %-100 %] 98 % (02/17 1106)  Intake/Output from previous day: 02/16 0701 - 02/17 0700 In: 5283.8 [P.O.:1680; I.V.:3003.8; IV Piggyback:600] Out: 5195 [Urine:4795; Blood:400] Intake/Output this shift: Total I/O In: -  Out: 400 [Urine:400]  Exam:  Dorsiflexion/Plantar flexion intact  Labs:  Recent Labs  06/21/16 0435  HGB 9.2*    Recent Labs  06/21/16 0435  WBC 6.5  RBC 2.87*  HCT 26.6*  PLT 185    Recent Labs  06/21/16 0435  NA 141  K 4.0  CL 111  CO2 24  BUN 16  CREATININE 0.77  GLUCOSE 141*  CALCIUM 8.6*   No results for input(s): LABPT, INR in the last 72 hours.  Assessment/Plan: Patient doing well with this left hip replacement.  She is moving well.  Anticipate no issues with discharge   G Dorene GrebeScott Kameah Rawl 06/21/2016, 12:00 PM

## 2016-06-21 NOTE — Evaluation (Signed)
Occupational Therapy One Time Evaluation Patient Details Name: Kimberly Johns Ammon MRN: [id number] DOB: 09-29-1940 Today's Date: 06/21/2016    History of Present Illness 76 y.o. female now s/p Lt anterior THA. PMH: Rt THA, Lt ankle fx.    Clinical Impression   Pt doing very well. All education completed with pt. She plans to sponge bathe initially and has all DME. She also has assist initially at home. No further acute OT needs.    Follow Up Recommendations  No OT follow up;Supervision - Intermittent    Equipment Recommendations  None recommended by OT    Recommendations for Other Services       Precautions / Restrictions Precautions Precautions: Fall Restrictions LLE Weight Bearing: Weight bearing as tolerated      Mobility Bed Mobility               General bed mobility comments: in chair.   Transfers Overall transfer level: Needs assistance Equipment used: Rolling walker (2 wheeled) Transfers: Sit to/from Stand Sit to Stand: Min guard         General transfer comment: cues for hand placement    Balance                                            ADL Overall ADL's : Needs assistance/impaired Eating/Feeding: Independent;Sitting   Grooming: Wash/dry hands;Min guard;Standing   Upper Body Bathing: Set up;Sitting   Lower Body Bathing: Minimal assistance;Sit to/from stand   Upper Body Dressing : Set up;Sitting   Lower Body Dressing: Minimal assistance;Sit to/from stand   Toilet Transfer: Min guard;Comfort height toilet;Grab bars;RW   PsychiatristToileting- Clothing Manipulation and Hygiene: Min guard;Sit to/from stand         General ADL Comments: Pt states she will start out with sponge bathing at home as her shower is upstairs and she plans to stay downstairs. DIscussed sequence for stepping in and out of shower once she does decide to use tub. She has a seat also for tub. Educated on AE options but she states she will have help initially  but will consider AE if needed also.  Min verbal cues given to fully back up to commode before attempting to sit down.      Vision     Perception     Praxis      Pertinent Vitals/Pain Pain Assessment: 0-10 Pain Score: 8  Pain Location: L hip Pain Descriptors / Indicators: Burning;Aching Pain Intervention(s): Monitored during session;Patient requesting pain meds-RN notified;Ice applied     Hand Dominance     Extremity/Trunk Assessment Upper Extremity Assessment Upper Extremity Assessment: Overall WFL for tasks assessed           Communication Communication Communication: No difficulties   Cognition Arousal/Alertness: Awake/alert Behavior During Therapy: WFL for tasks assessed/performed Overall Cognitive Status: Within Functional Limits for tasks assessed                     General Comments       Exercises       Shoulder Instructions      Home Living Family/patient expects to be discharged to:: Private residence Living Arrangements: Alone Available Help at Discharge: Family;Friend(s);Available 24 hours/day Type of Home: House Home Access: Stairs to enter [employer] CorporationEntrance Stairs-Number of Steps: 3 Entrance Stairs-Rails: Right Home Layout: Two level Alternate Level Stairs-Number of Steps: flight   Bathroom  Shower/Tub: Tub/shower unit (shower on second level)   Bathroom Toilet: Standard     Home Equipment: Walker - 2 wheels;Walker - 4 wheels;Cane - single point;Bedside commode;Shower seat   Additional Comments: will have brother and friends assisting at home      Prior Functioning/Environment Level of Independence: Independent                 OT Problem List:     OT Treatment/Interventions:      OT Goals(Current goals can be found in the care plan section) Acute Rehab OT Goals Patient Stated Goal: return home OT Goal Formulation: With patient  OT Frequency:     Barriers to D/C:            Co-evaluation              End of  Session Equipment Utilized During Treatment: Rolling walker  Activity Tolerance: Patient tolerated treatment well (did have pain and notified nursing for meds.) Patient left: in chair;with call bell/phone within reach   Time: 1125-1159 OT Time Calculation (min): 34 min Charges:  OT General Charges $OT Visit: 1 Procedure OT Evaluation $OT Eval Low Complexity: 1 Procedure OT Treatments $Therapeutic Activity: 8-22 mins G-Codes:    Lennox Laity 06/21/2016, 1:05 PM

## 2016-06-21 NOTE — Progress Notes (Addendum)
Physical Therapy Treatment Patient Details Name: Kimberly Johns MRN: [id number] DOB: 1941-03-23 Today's Date: 06/21/2016    History of Present Illness 76 y.o. female now s/p Lt anterior THA. PMH: Rt THA, Lt ankle fx.     PT Comments    Pt continuing to make excellent progress. Anticipate pt will D/C to home tomorrow. Pt denying any questions or concerns following session. Pt able to ambulate 300 ft with rw and supervision.   Follow Up Recommendations  Home health PT;Supervision - Intermittent     Equipment Recommendations  None recommended by PT    Recommendations for Other Services       Precautions / Restrictions Precautions Precautions: None Restrictions Weight Bearing Restrictions: Yes LLE Weight Bearing: Weight bearing as tolerated    Mobility  Bed Mobility Overal bed mobility: Needs Assistance Bed Mobility: Sit to Supine       Sit to supine: Supervision   General bed mobility comments: in chair.   Transfers Overall transfer level: Needs assistance Equipment used: Rolling walker (2 wheeled) Transfers: Sit to/from Stand Sit to Stand: Supervision         General transfer comment: good technique  Ambulation/Gait Ambulation/Gait assistance: Supervision Ambulation Distance (Feet): 300 Feet Assistive device: Rolling walker (2 wheeled) Gait Pattern/deviations: Step-through pattern Gait velocity: decreased   General Gait Details: good stability, no loss of balance. Cues initially for step through pattern.    Stairs            Wheelchair Mobility    Modified Rankin (Stroke Patients Only)       Balance Overall balance assessment: Needs assistance Sitting-balance support: No upper extremity supported Sitting balance-Leahy Scale: Good     Standing balance support: During functional activity Standing balance-Leahy Scale: Fair Standing balance comment: using rw for ambulation                    Cognition Arousal/Alertness:  Awake/alert Behavior During Therapy: WFL for tasks assessed/performed Overall Cognitive Status: Within Functional Limits for tasks assessed                      Exercises Total Joint Exercises Ankle Circles/Pumps: AROM;Both;15 reps Quad Sets: Strengthening;Both;10 reps Gluteal Sets: Strengthening;Both;10 reps Heel Slides: AAROM;Left;10 reps Hip ABduction/ADduction: Strengthening;Left;10 reps    General Comments        Pertinent Vitals/Pain Pain Assessment: 0-10 Pain Score: 2  Pain Location: L hip Pain Descriptors / Indicators: Sore Pain Intervention(s): Limited activity within patient's tolerance;Monitored during session;Ice applied    Home Living Family/patient expects to be discharged to:: Private residence Living Arrangements: Alone Available Help at Discharge: Family;Friend(s);Available 24 hours/day Type of Home: House Home Access: Stairs to enter Entrance Stairs-Rails: Right Home Layout: Two level Home Equipment: Walker - 2 wheels;Walker - 4 wheels;Cane - single point;Bedside commode;Shower seat Additional Comments: will have brother and friends assisting at home    Prior Function Level of Independence: Independent          PT Goals (current goals can now be found in the care plan section) Acute Rehab PT Goals Patient Stated Goal: return home PT Goal Formulation: With patient Time For Goal Achievement: 07/05/16 Potential to Achieve Goals: Good Progress towards PT goals: Progressing toward goals    Frequency    7X/week      PT Plan Current plan remains appropriate    Co-evaluation             End of Session   Activity Tolerance: Patient  tolerated treatment well Patient left: in bed;with call bell/phone within reach     Time: 1507-1530 PT Time Calculation (min) (ACUTE ONLY): 23 min  Charges:  $Gait Training: 8-22 mins $Therapeutic Exercise: 8-22 mins                    G Codes:      Christiane HaBenjamin J. Braxson Hollingsworth, PT, CSCS Pager [phone]  [phone]  06/21/2016, 3:37 PM

## 2016-06-22 LAB — CBC
HCT: 26.3 % — ABNORMAL LOW (ref 36.0–46.0)
Hemoglobin: 8.8 g/dL — ABNORMAL LOW (ref 12.0–15.0)
MCH: 31.4 pg (ref 26.0–34.0)
MCHC: 33.5 g/dL (ref 30.0–36.0)
MCV: 93.9 fL (ref 78.0–100.0)
PLATELETS: 165 10*3/uL (ref 150–400)
RBC: 2.8 MIL/uL — ABNORMAL LOW (ref 3.87–5.11)
RDW: 15 % (ref 11.5–15.5)
WBC: 5.1 10*3/uL (ref 4.0–10.5)

## 2016-06-22 NOTE — Progress Notes (Signed)
Physical Therapy Treatment Patient Details Name: Kimberly Johns MRN: [id number] DOB: 04/27/1941 Today's Date: 06/22/2016    History of Present Illness 76 y.o. female now s/p Lt anterior THA. PMH: Rt THA, Lt ankle fx.     PT Comments    PT tolerated session well today. Reviewed HEP, walking and steps with patient. Pt has no further concerns for DC home. All therapy complete at this time.   Follow Up Recommendations  Home health PT;Supervision - Intermittent     Equipment Recommendations  None recommended by PT    Recommendations for Other Services       Precautions / Restrictions Precautions Precautions: None Precaution Comments: HEP provided Restrictions Weight Bearing Restrictions: No LLE Weight Bearing: Weight bearing as tolerated    Mobility  Bed Mobility Overal bed mobility: Needs Assistance Bed Mobility: Sit to Supine     Supine to sit: Min assist (with LE )        Transfers Overall transfer level: Needs assistance Equipment used: Rolling walker (2 wheeled) Transfers: Sit to/from Stand Sit to Stand: Supervision         General transfer comment: good technique  Ambulation/Gait Ambulation/Gait assistance: Supervision Ambulation Distance (Feet): 250 Feet Assistive device: Rolling walker (2 wheeled) Gait Pattern/deviations: Step-through pattern Gait velocity: decreased   General Gait Details: good stability, no loss of balance.    Stairs Stairs: Yes   Stair Management: One rail Right Number of Stairs: 4 General stair comments: great technique did not have to rely on hand held too much, step to pattern and pot rememebered the sequence.   Wheelchair Mobility    Modified Rankin (Stroke Patients Only)       Balance                                    Cognition Arousal/Alertness: Awake/alert Behavior During Therapy: WFL for tasks assessed/performed Overall Cognitive Status: Within Functional Limits for tasks assessed                       Exercises Total Joint Exercises Ankle Circles/Pumps: AROM;Both;15 reps Quad Sets: Strengthening;Both;10 reps Gluteal Sets: Strengthening;Both;10 reps Heel Slides: AAROM;Left;10 reps Hip ABduction/ADduction: Strengthening;Left;10 reps;AAROM    General Comments        Pertinent Vitals/Pain Pain Score: 3  Pain Location: L hip. Had pain last night with increaed swelling but reports it is better this morning. Educated with glut and quad sets for muscle pump to help with swelling. Ice applied after session  Pain Descriptors / Indicators: Sore Pain Intervention(s): Monitored during session;Ice applied    Home Living                      Prior Function            PT Goals (current goals can now be found in the care plan section) Acute Rehab PT Goals Patient Stated Goal: return home PT Goal Formulation: With patient Time For Goal Achievement: 07/05/16 Potential to Achieve Goals: Good Progress towards PT goals: Progressing toward goals    Frequency    7X/week      PT Plan Current plan remains appropriate    Co-evaluation             End of Session   Activity Tolerance: Patient tolerated treatment well Patient left: in bed;with call bell/phone within reach     Time: 0920-0950 PT  Time Calculation (min) (ACUTE ONLY): 30 min  Charges:  $Gait Training: 8-22 mins $Therapeutic Exercise: 8-22 mins                    G Codes:      Marella BileBRITT, [address] 06/22/2016, 10:00 AM Marella BileSharron Shareena Nusz, PT Pager: [phone] 06/22/2016

## 2016-06-22 NOTE — Progress Notes (Signed)
Subjective: Patient stable.  Walking in hall.  Pain is very well controlled.   Objective: Vital signs in last 24 hours: Temp:  [97.6 F (36.4 C)-98.3 F (36.8 C)] 98.2 F (36.8 C) (02/18 0630) Pulse Rate:  [68-86] 86 (02/18 0630) Resp:  [14-16] 16 (02/18 0630) BP: (107-128)/(53-65) 107/57 (02/18 0630) SpO2:  [94 %-98 %] 94 % (02/18 0630)  Intake/Output from previous day: 02/17 0701 - 02/18 0700 In: 945.3 [P.O.:600; I.V.:345.3] Out: 1500 [Urine:1500] Intake/Output this shift: No intake/output data recorded.  Exam:  Dorsiflexion/Plantar flexion intact  Labs:  Recent Labs  06/21/16 0435 06/22/16 0441  HGB 9.2* 8.8*    Recent Labs  06/21/16 0435 06/22/16 0441  WBC 6.5 5.1  RBC 2.87* 2.80*  HCT 26.6* 26.3*  PLT 185 165    Recent Labs  06/21/16 0435  NA 141  K 4.0  CL 111  CO2 24  BUN 16  CREATININE 0.77  GLUCOSE 141*  CALCIUM 8.6*   No results for input(s): LABPT, INR in the last 72 hours.  Assessment/Plan: Patient doing well.  Discharge to home today.   G Scott Carilyn Woolston 06/22/2016, 10:00 AM

## 2016-06-23 ENCOUNTER — Telehealth (INDEPENDENT_AMBULATORY_CARE_PROVIDER_SITE_OTHER): Payer: Self-pay | Admitting: *Deleted

## 2016-06-23 NOTE — Telephone Encounter (Signed)
Randa EvensJoanne from [facility] called this morning in regards to this patient declining physical therapy. She just wanted to let us know that the patient did not think that it was necessary. Joanne's CB # [phone]. Thank you

## 2016-06-23 NOTE — Op Note (Signed)
NAME:  Kimberly Johns, Kimberly Johns                     ACCOUNT NO.:  MEDICAL RECORD NO.:  [id number]  LOCATION:                                 FACILITY:  PHYSICIAN:  Vanita PandaChristopher Y. Magnus IvanBlackman, M.D.DATE OF BIRTH:  DATE OF PROCEDURE:  06/20/2016 DATE OF DISCHARGE:                              OPERATIVE REPORT   PREOPERATIVE DIAGNOSIS:  Primary osteoarthritis and degenerative joint disease, left hip.  POSTOPERATIVE DIAGNOSIS:  Primary osteoarthritis and degenerative joint disease, left hip.  PROCEDURE:  Left total hip arthroplasty through direct anterior approach.  IMPLANTS:  DePuy Sector Gription acetabular component size 52, size 36+ 0 polyethylene liner, size 10 Corail femoral component with varus offset, size 36+ 5 ceramic hip ball.  SURGEON:  Vanita PandaChristopher Y. Magnus IvanBlackman, M.D.  ASSISTANT:  Richardean CanalGilbert Clark, PA-C.  ANESTHESIA:  Spinal.  ANTIBIOTICS:  2 g of IV Ancef.  BLOOD LOSS:  Less than 500 mL.  COMPLICATIONS:  None.  HISTORY:  Kimberly Johns is a 76 year old patient, well known to me.  She has debilitating arthritis involving both of her hips.  She underwent a successful right total hip arthroplasty through direct anterior approach by me in October of 2016.  Her left hip pain is severe now, it is detrimentally affecting her activities of daily living, her quality of life and her mobility, to the point, she does wish to proceed with a total hip replacement on the left side.  She has tried and failed all forms of conservative treatment.  At this point, she understands having had this done the risk of acute blood loss anemia, nerve and vessel injury, fracture, infection, dislocation, DVT.  She understands our goals are decreased pain, improved mobility and overall improved quality of life.  PROCEDURE DESCRIPTION:  After informed consent was obtained, appropriate left hip was marked.  She was brought to the operating room where spinal anesthesia was obtained while she was on the stretcher.   Foley catheter was placed.  Then, both feet had traction boots applied to them.  Next, she was placed supine on the Hana fracture table with the perineal post in place and both legs in inline skeletal traction devices, but no traction applied.  Her left operative hip was prepped and draped with DuraPrep and sterile drapes.  Time-out was called and she was identified as correct patient and correct left hip.  I then made incision just inferior and posterior to the anterior superior iliac spine and carried this obliquely down the leg.  We dissected down the tensor fascia lata muscle.  The tensor fascia was then divided longitudinally to proceed with a direct anterior approach to the hip.  We identified and cauterized the circumflex vessels and then identified the hip capsule to open up the hip capsule in an L-type format finding a moderate joint effusion and significant periarticular osteophytes.  We then placed a bent Hohmann over the medial acetabular rim and Cobra retractors around the medial and lateral femoral neck.  We made our femoral neck cut with an oscillating saw just proximal to lesser trochanter and completed this with an osteotome.  I placed a corkscrew guide in the femoral head and removed the femoral  head in its entirety and found it to be completely devoid of cartilage.  We then cleaned the acetabulum and remnants of the acetabular labrum and then began reaming under direct visualization from a size 42 reamer up to a size 52.  We were able to then place the real DePuy Sector Gription acetabular component size 52 under direct fluoroscopy, so we could obtain our depth of reaming and our death of placement of the cup as well as inclination and anteversion.  We then placed the real DePuy Sector Gription acetabular component again size 52 and a 36+ 0 polyethylene liner for that size acetabular component. Attention was then turned to the femur.  With the leg externally rotated to  120 degrees, extended and adducted, we were able to place a Mueller retractor medially and a Hohmann retractor behind the greater trochanter.  We released the lateral joint capsule and then began broaching from a size 8 broach using the Corail broaching system up to a size 10, this correlated with her side.  We then trialed a standard offset femoral neck and a 36+ 1.5 hip ball and reduced in the acetabulum, and we felt that it was stable, so we dislocated the hip and removed the trial components.  I then placed a real femoral component size 10 with standard offset and the real 36+ 1.5 hip ball.  We reduced this in the pelvis, but I felt like on external rotation, she showed some instability because we only get her 90, and I pulled in with a little Shuck and dislocated easily.  With that being said with hip dislocated, we brought down and under and then assessed the stem.  I felt that we needed to change her version of the stem, so we took that femoral component out, I went with a varus offset femoral neck and internally retroverted the stem more and then with a 36+ 5 hip ball, we brought the back over and up with traction and internal rotation reducing the pelvis, and at that point, we were pleased with rotation, offset and stability.  We then irrigated the soft tissue with normal saline solution using pulsatile lavage.  I was able to close the joint capsule with interrupted #1 Ethibond suture followed by running #1 Vicryl in the tensor fascia, 0 Vicryl in the deep tissue, 2-0 Vicryl in the subcutaneous tissue, 4-0 Monocryl subcuticular stitch and Steri- Strips on the skin.  An Aquacel dressing was applied.  She was taken off the fracture table and taken to the recovery room in stable condition. All final counts were correct.  There were no complications noted.  Of note, Richardean Canal, PA-C assisted in the entire case.  His assistance was crucial for facilitating all aspects of this  case.     Vanita Panda. Magnus Ivan, M.D.     CYB/MEDQ  D:  06/20/2016  T:  06/21/2016  Job:  [id number]

## 2016-06-23 NOTE — Telephone Encounter (Signed)
See below

## 2016-06-24 ENCOUNTER — Telehealth (INDEPENDENT_AMBULATORY_CARE_PROVIDER_SITE_OTHER): Payer: Self-pay | Admitting: Orthopaedic Surgery

## 2016-06-24 NOTE — Telephone Encounter (Signed)
FYI

## 2016-06-24 NOTE — Telephone Encounter (Signed)
Kimberly HughesJoanne Johns (PT) from [facility] called advised she called patient yesterday to arrange (PT) @ home but, patient declined. Patient said she know what to do from having (PT) last year. The number to contact Kimberly EvensJoanne is [phone]

## 2016-06-26 NOTE — Discharge Summary (Signed)
Patient ID: Kimberly Johns MRN: [id number] DOB/AGE: June 19, 1940 76 y.o.  Admit date: 06/20/2016 Discharge date: 06/26/2016  Admission Diagnoses:  Principal Problem:   Unilateral primary osteoarthritis, left hip Active Problems:   Status post left hip replacement   Discharge Diagnoses:  Same  Past Medical History:  Diagnosis Date  . Arthritis   . Colon polyps   . Diverticulosis   . Fracture of ankle, trimalleolar, left, closed   . Gallbladder disease   . GERD (gastroesophageal reflux disease)   . History of chicken pox   . HLD (hyperlipidemia)   . Hypothyroidism   . Kidney cysts   . Liver cyst   . Measles    hx of   . Mumps    hx of   . Shingles    hx of     Surgeries: Procedure(s): LEFT TOTAL HIP ARTHROPLASTY ANTERIOR APPROACH on 06/20/2016   Consultants:   Discharged Condition: Improved  Hospital Course: Kimberly Johns is an 76 y.o. female who was admitted 06/20/2016 for operative treatment ofUnilateral primary osteoarthritis, left hip. Patient has severe unremitting pain that affects sleep, daily activities, and work/hobbies. After pre-op clearance the patient was taken to the operating room on 06/20/2016 and underwent  Procedure(s): LEFT TOTAL HIP ARTHROPLASTY ANTERIOR APPROACH.    Patient was given perioperative antibiotics:  Anti-infectives    Start     Dose/Rate Route Frequency Ordered Stop   06/21/16 0000  doxycycline (VIBRA-TABS) 100 MG tablet     100 mg Oral 2 times daily 06/21/16 1412     06/20/16 1800  ceFAZolin (ANCEF) IVPB 1 g/50 mL premix     1 g 100 mL/hr over 30 Minutes Intravenous Every 6 hours 06/20/16 1523 06/21/16 0221   06/20/16 1001  ceFAZolin (ANCEF) IVPB 2g/100 mL premix     2 g 200 mL/hr over 30 Minutes Intravenous On call to O.R. 06/20/16 1001 06/20/16 1231       Patient was given sequential compression devices, early ambulation, and chemoprophylaxis to prevent DVT.  Patient benefited maximally from hospital stay and there were no  complications.    Recent vital signs: No data found.    Recent laboratory studies: No results for input(s): WBC, HGB, HCT, PLT, NA, K, CL, CO2, BUN, CREATININE, GLUCOSE, INR, CALCIUM in the last 72 hours.  Invalid input(s): PT, 2   Discharge Medications:   Allergies as of 06/22/2016      Reactions   Vancomycin Rash      Medication List    TAKE these medications   acetaminophen 500 MG tablet Commonly known as:  TYLENOL Take 500 mg by mouth every 6 (six) hours as needed.   aspirin 81 MG chewable tablet Chew 1 tablet (81 mg total) by mouth 2 (two) times daily.   calcium-vitamin D 500-200 MG-UNIT tablet Commonly known as:  OSCAL WITH D Take 1 tablet by mouth daily with breakfast.   celecoxib 200 MG capsule Commonly known as:  CELEBREX Take 1 capsule (200 mg total) by mouth 2 (two) times daily.   doxycycline 100 MG tablet Commonly known as:  VIBRA-TABS Take 1 tablet (100 mg total) by mouth 2 (two) times daily.   estrogen (conjugated)-medroxyprogesterone 0.3-1.5 MG tablet Commonly known as:  PREMPRO Take 1 tablet by mouth every evening.   Glucosamine-Chondroitin-MSM [phone] MG Tabs Take 1 tablet by mouth every evening.   HYDROcodone-acetaminophen 5-325 MG tablet Commonly known as:  NORCO Take 1 tablet by mouth every 6 (six) hours as needed for moderate  pain. One to two tabs every 4-6 hours for pain   levothyroxine 88 MCG tablet Commonly known as:  SYNTHROID, LEVOTHROID Take 88 mcg by mouth daily before breakfast.   METAMUCIL 30.9 % Powd Generic drug:  Psyllium Take 1 packet by mouth at bedtime.   methocarbamol 500 MG tablet Commonly known as:  ROBAXIN Take 1 tablet (500 mg total) by mouth every 6 (six) hours as needed for muscle spasms.   omeprazole 40 MG capsule Commonly known as:  PRILOSEC Take 40 mg by mouth every morning.   oxyCODONE-acetaminophen 5-325 MG tablet Commonly known as:  ROXICET Take 1-2 tablets by mouth every 4 (four) hours as  needed.   pravastatin 40 MG tablet Commonly known as:  PRAVACHOL Take 40 mg by mouth every evening.   Vitamin D3 5000 units Caps Take 1 capsule by mouth daily.   WOMENS MULTIVITAMIN PLUS Tabs Take 1 tablet by mouth every evening.       Diagnostic Studies: Dg Pelvis Portable  Result Date: 06/20/2016 CLINICAL DATA:  Status post left hip replacement EXAM: PORTABLE PELVIS 1-2 VIEWS COMPARISON:  None. FINDINGS: Bilateral hip replacements are seen. No acute bony abnormality is noted. No soft tissue abnormality is seen. IMPRESSION: Status post left hip replacement without acute abnormality.  \ These results will be called to the ordering clinician or representative by the Radiologist Assistant, and communication documented in the PACS or zVision Dashboard. Electronically Signed   By: Alcide CleverMark  Lukens M.D.   On: 06/20/2016 14:27   Dg C-arm 1-60 Min-no Report  Result Date: 06/20/2016 Fluoroscopy was utilized by the requesting physician.  No radiographic interpretation.   Dg Hip Operative Unilat With Pelvis Left  Result Date: 06/20/2016 CLINICAL DATA:  Left total hip replacement. EXAM: OPERATIVE left HIP (WITH PELVIS IF PERFORMED) 2 VIEWS FLUOROSCOPY TIME:  30 seconds. TECHNIQUE: Fluoroscopic spot image(s) were submitted for interpretation post-operatively. COMPARISON:  None. FINDINGS: The femoral and acetabular components appear to be well situated. No fracture or dislocation is noted. Expected postoperative changes are noted in the surrounding soft tissues. IMPRESSION: Status post left total hip arthroplasty. Electronically Signed   By: Lupita RaiderJames  Green Jr, M.D.   On: 06/20/2016 13:50    Disposition: 01-Home or Self Care  Discharge Instructions    Call MD / Call 911    Complete by:  As directed    If you experience chest pain or shortness of breath, CALL 911 and be transported to the hospital emergency room.  If you develope a fever above 101 F, pus (white drainage) or increased drainage or redness  at the wound, or calf pain, call your surgeon's office.   Constipation Prevention    Complete by:  As directed    Drink plenty of fluids.  Prune juice may be helpful.  You may use a stool softener, such as Colace (over the counter) 100 mg twice a day.  Use MiraLax (over the counter) for constipation as needed.   Diet - low sodium heart healthy    Complete by:  As directed    Increase activity slowly as tolerated    Complete by:  As directed       Follow-up Information    [facility] Follow up.   Specialty:  Home Health Services Why:  Home Health Physical Therapy Contact information: [address] [place] [address] [place] [address] [place] [address] [phone]        Kathryne Hitchhristopher Y Blackman, MD Follow up in 2 week(s).   Specialty:  Orthopedic  Surgery Contact information: [address] [place] [address] [phone]            Signed: Kathryne Hitch 06/26/2016, 5:22 PM

## 2016-07-03 ENCOUNTER — Ambulatory Visit (INDEPENDENT_AMBULATORY_CARE_PROVIDER_SITE_OTHER): Payer: PPO | Admitting: Orthopaedic Surgery

## 2016-07-03 DIAGNOSIS — M1612 Unilateral primary osteoarthritis, left hip: Secondary | ICD-10-CM

## 2016-07-03 MED ORDER — OXYCODONE-ACETAMINOPHEN 5-325 MG PO TABS: 1.0000 | ORAL_TABLET | Freq: Four times a day (QID) | ORAL | 0 refills | Status: DC | PRN

## 2016-07-03 MED FILL — OXYCODONE/APAP 5/325 MG TAB: 5-325 | 8 days supply | Qty: 60 | Fill #0

## 2016-07-03 NOTE — Progress Notes (Signed)
The patient is now 13 days post a left anterior hip replacement. She started walking without assistive device and doing well.  On examination incision looks great. I removed all sutures and placed knee Steri-Strips. She does have a seroma that I drained 60 mL of fluid from this gave her good relief. Her leg lengths are equal.  We'll continue to follow along she'll increase her activities I did refill her oxycodone. She'll stop her aspirin. She does have a wound on her left ankle laterally that is from an old plate and screws retained from an old injury. This really aggravated during skiing this winter. This created a small wound. She will treat this locally. She'll eventually need to have this hardware removed. We'll see her back in a month's he has doing overall but no x-rays are needed.

## 2016-07-07 MED FILL — PREMPRO 0.3 MG-1.5 MG TAB: 0.3-1.5 | 84 days supply | Qty: 84 | Fill #2

## 2016-07-15 MED FILL — LEVOTHYROXINE 88 MCG TABLET: 88 | 30 days supply | Qty: 34 | Fill #0

## 2016-07-23 ENCOUNTER — Other Ambulatory Visit (INDEPENDENT_AMBULATORY_CARE_PROVIDER_SITE_OTHER): Payer: Self-pay | Admitting: Physician Assistant

## 2016-07-23 MED FILL — CELECOXIB 200 MG CAP: 200 | 30 days supply | Qty: 60 | Fill #0

## 2016-07-23 NOTE — Telephone Encounter (Signed)
Please advise 

## 2016-07-31 ENCOUNTER — Ambulatory Visit (INDEPENDENT_AMBULATORY_CARE_PROVIDER_SITE_OTHER): Payer: PPO | Admitting: Orthopaedic Surgery

## 2016-07-31 DIAGNOSIS — Z96641 Presence of right artificial hip joint: Secondary | ICD-10-CM

## 2016-07-31 NOTE — Progress Notes (Signed)
The patient is now 6 weeks as was a left total hip arthroplasty through direct injury approach for she has no issues the hip at all. She was discussed at some point are removal from plating of her left fibula was done by another physician in town years ago. That is been causing some 1 Coppage locations her ankle especially during ski season when she wears a boot. He is very active 76 year old.  On examination her left ankle wound is healing over but is still has a scab he can tell is from permanent hardware. There is no evidence infection all. She will also without any assistive device no let her left hip exam is normal now.  At this point she will still consider hardware removal in July. I'll see her some sometime in mid June to work on setting the surgery up in July. When she does come back and bandaging I would like a single mortise view of the left ankle. No hip x-rays are needed at that visit.

## 2016-08-04 MED FILL — PRAVASTATIN NA 40 MG TAB: 40 | 90 days supply | Qty: 90 | Fill #5

## 2016-08-11 MED FILL — LEVOTHYROXINE 88 MCG TABLET: 88 | 30 days supply | Qty: 34 | Fill #1

## 2016-08-13 DIAGNOSIS — Z1231 Encounter for screening mammogram for malignant neoplasm of breast: Secondary | ICD-10-CM | POA: Diagnosis not present

## 2016-08-13 DIAGNOSIS — N951 Menopausal and female climacteric states: Secondary | ICD-10-CM | POA: Diagnosis not present

## 2016-08-29 MED FILL — OMEPRAZOLE DR 40 MG CAPSULE: 40 | 90 days supply | Qty: 90 | Fill #0

## 2016-09-10 MED FILL — LEVOTHYROXINE 88 MCG TABLET: 88 | 30 days supply | Qty: 34 | Fill #2

## 2016-10-02 MED FILL — PREMPRO 0.3 MG-1.5 MG TAB: 0.3-1.5 | 84 days supply | Qty: 84 | Fill #3

## 2016-10-05 MED FILL — CELECOXIB 200 MG CAP: 200 | 30 days supply | Qty: 60 | Fill #1

## 2016-10-08 MED FILL — LEVOTHYROXINE 88 MCG TABLET: 88 | 30 days supply | Qty: 34 | Fill #3

## 2016-10-28 ENCOUNTER — Ambulatory Visit (INDEPENDENT_AMBULATORY_CARE_PROVIDER_SITE_OTHER): Payer: PPO

## 2016-10-28 ENCOUNTER — Encounter (INDEPENDENT_AMBULATORY_CARE_PROVIDER_SITE_OTHER): Payer: Self-pay | Admitting: Orthopaedic Surgery

## 2016-10-28 ENCOUNTER — Ambulatory Visit (INDEPENDENT_AMBULATORY_CARE_PROVIDER_SITE_OTHER): Payer: PPO | Admitting: Physician Assistant

## 2016-10-28 ENCOUNTER — Ambulatory Visit (INDEPENDENT_AMBULATORY_CARE_PROVIDER_SITE_OTHER): Payer: Self-pay

## 2016-10-28 VITALS — Ht 65.0 in | Wt 149.0 lb

## 2016-10-28 DIAGNOSIS — M25572 Pain in left ankle and joints of left foot: Secondary | ICD-10-CM

## 2016-10-28 NOTE — Progress Notes (Signed)
Office Visit Note   Patient: Kimberly Johns           Date of Birth: 1940-09-30           MRN: [id number] Visit Date: 10/28/2016              Requested by: Shirlean Mylar, MD [address] [place], [place] [address] PCP: Shirlean Mylar, MD   Assessment & Plan: Visit Diagnoses:  1. Pain in left ankle and joints of left foot     Plan: Severe 2 weeks status post left ankle lateral malleolus hardware removal. Magnus Ivan myself discussed with the patient that postop she can be weightbearing as tolerated. No high-impact activities for a month but she is able to exercise. Risk including prolonged pain , worsening pain , inability to remove all hardware, infection and nerve or vessel injury. She did like her to proceed with this in the near future.  Follow-Up Instructions: Return for  2 weeks postop.   Orders:  Orders Placed This Encounter  Procedures  . XR Ankle Complete Left   No orders of the defined types were placed in this encounter.     Procedures: No procedures performed   Clinical Data: No additional findings.   Subjective: Chief Complaint  Patient presents with  . Left Ankle - Follow-up    Discuss hardware removal    HPI Skater returns today due to her left ankle and prominent hardware. She states she continues to have some drainage periodically from the lateral malleolus where there is some prominence of her hardware. Again she had an ankle fracture some 10 years ago and had bimalleolar ankle fracture that she underwent an open reduction internal fixation for. She's having no fevers chills. She would like to get back into her ski boots his winter and her boots deaf ligament of the lateral aspect of the ankle where she has the hardware prominence. Review of Systems See history of present illness  Objective: Vital Signs: Ht 5\' 5"  (1.651 m)   Wt 149 lb (67.6 kg)   BMI 24.79 kg/m   Physical Exam  Constitutional: She is oriented to person, place, and  time. She appears well-developed and well-nourished. No distress.  Cardiovascular: Intact distal pulses.   Pulmonary/Chest: Effort normal.  Neurological: She is alert and oriented to person, place, and time.  Skin: She is not diaphoretic.  Psychiatric: She has a normal mood and affect. Her behavior is normal.    Ortho Exam Left ankle she has good range of motion the ankle without pain. Inversion eversion. 5 out of 5 strengths against resistance. She's nontender over the posterior tibial tendon peritenon tendons. Achilles is intact. Incisions are all well-healed. She has a scab over the distal most portion of the incision over the lateral malleolus. There is prominence of hardware over the distal lateral malleolus. There's no prominence over the medial malleolus. Specialty Comments:  No specialty comments available.  Imaging: Xr Ankle Complete Left  Result Date: 10/28/2016 Left ankle 3 views: No acute fracture. No hardware failure. Lateral malleolus is distal 2 screws is slightly prominent. Talus is well located within the ankle mortise no diastases.    PMFS History: Patient Active Problem List   Diagnosis Date Noted  . Unilateral primary osteoarthritis, left hip 06/20/2016  . Status post left hip replacement 06/20/2016  . Osteoarthritis of right hip 12/08/2014  . Status post total replacement of right hip 12/08/2014  . Abdominal pain, epigastric 07/21/2012  . Esophageal reflux  07/21/2012  . Personal history of colonic polyps 07/21/2012   Past Medical History:  Diagnosis Date  . Arthritis   . Colon polyps   . Diverticulosis   . Fracture of ankle, trimalleolar, left, closed   . Gallbladder disease   . GERD (gastroesophageal reflux disease)   . History of chicken pox   . HLD (hyperlipidemia)   . Hypothyroidism   . Kidney cysts   . Liver cyst   . Measles    hx of   . Mumps    hx of   . Shingles    hx of     Family History  Problem Relation Age of Onset  . Alzheimer's  disease Mother 4763  . Colon cancer Neg Hx   . Esophageal cancer Neg Hx   . Rectal cancer Neg Hx   . Stomach cancer Neg Hx     Past Surgical History:  Procedure Laterality Date  . ANKLE FRACTURE SURGERY Left 2009  . ANTERIOR CRUCIATE LIGAMENT REPAIR Left 2001  . APPENDECTOMY  2010  . COLONOSCOPY WITH PROPOFOL N/A 10/30/2014   Procedure: COLONOSCOPY WITH PROPOFOL;  Surgeon: Charolett BumpersMartin K Johnson, MD;  Location: [facility];  Service: Endoscopy;  Laterality: N/A;  . TONSILLECTOMY     age 89  . TONSILLECTOMY    . TOTAL HIP ARTHROPLASTY Right 12/08/2014   Procedure: RIGHT TOTAL HIP ARTHROPLASTY ANTERIOR APPROACH;  Surgeon: Kathryne Hitchhristopher Y Blackman, MD;  Location: [facility];  Service: Orthopedics;  Laterality: Right;  . TOTAL HIP ARTHROPLASTY Left 06/20/2016   Procedure: LEFT TOTAL HIP ARTHROPLASTY ANTERIOR APPROACH;  Surgeon: Kathryne Hitchhristopher Y Blackman, MD;  Location: [facility];  Service: Orthopedics;  Laterality: Left;   Social History   Occupational History  . radiology tech    Social History Main Topics  . Smoking status: Former Smoker    Packs/day: 0.50    Years: 6.00    Types: Cigarettes    Quit date: 10/22/1968  . Smokeless tobacco: Never Used  . Alcohol use No  . Drug use: No  . Sexual activity: Not on file

## 2016-11-06 MED FILL — LEVOTHYROXINE 88 MCG TABLET: 88 | 30 days supply | Qty: 34 | Fill #4

## 2016-11-06 MED FILL — PRAVASTATIN NA 40 MG TAB: 40 | 30 days supply | Qty: 30 | Fill #0

## 2016-11-13 ENCOUNTER — Encounter: Payer: Self-pay | Admitting: Orthopaedic Surgery

## 2016-11-13 DIAGNOSIS — T8484XD Pain due to internal orthopedic prosthetic devices, implants and grafts, subsequent encounter: Secondary | ICD-10-CM | POA: Diagnosis not present

## 2016-11-13 DIAGNOSIS — T84498A Other mechanical complication of other internal orthopedic devices, implants and grafts, initial encounter: Secondary | ICD-10-CM | POA: Diagnosis not present

## 2016-11-13 DIAGNOSIS — M25572 Pain in left ankle and joints of left foot: Secondary | ICD-10-CM | POA: Diagnosis not present

## 2016-11-14 NOTE — Addendum Note (Signed)
Addendum  created 11/14/16 1705 by Cristela BlueJackson, Mckenna Boruff, MD   Sign clinical note

## 2016-11-14 NOTE — Anesthesia Postprocedure Evaluation (Signed)
Anesthesia Post Note  Patient: Kimberly Johns  Procedure(s) Performed: Procedure(s) (LRB): LEFT TOTAL HIP ARTHROPLASTY ANTERIOR APPROACH (Left)     Anesthesia Post Evaluation  Last Vitals:  Vitals:   06/21/16 2112 06/22/16 0630  BP: (!) 128/53 (!) 107/57  Pulse: 84 86  Resp: 14 16  Temp: 36.8 C 36.8 C    Last Pain:  Vitals:   06/22/16 1105  TempSrc:   PainSc: 4                  Antwanette Wesche EDWARD

## 2016-11-17 MED FILL — HYDROCODON-APAP 5-325: 5-325 | 5 days supply | Qty: 40 | Fill #0

## 2016-11-26 MED FILL — OMEPRAZOLE DR 40 MG CAPSULE: 40 | 90 days supply | Qty: 90 | Fill #1

## 2016-12-01 ENCOUNTER — Ambulatory Visit (INDEPENDENT_AMBULATORY_CARE_PROVIDER_SITE_OTHER): Payer: PPO | Admitting: Orthopaedic Surgery

## 2016-12-01 ENCOUNTER — Ambulatory Visit (INDEPENDENT_AMBULATORY_CARE_PROVIDER_SITE_OTHER): Payer: PPO

## 2016-12-01 DIAGNOSIS — Z9889 Other specified postprocedural states: Secondary | ICD-10-CM

## 2016-12-01 MED ORDER — DOXYCYCLINE HYCLATE 100 MG PO TABS: 100.0000 mg | ORAL_TABLET | Freq: Two times a day (BID) | ORAL | 0 refills | Status: DC

## 2016-12-01 MED FILL — DOXYCYCLINE HYC 100 MG TAB: 100 | 12 days supply | Qty: 25 | Fill #0

## 2016-12-01 NOTE — Progress Notes (Signed)
The patient is 2 weeks status post removal of symptomatic prominent hardware from a left ankle lateral malleolus. The hardware was placed 10 years ago by another Careers advisersurgeon. The distal screws were causing skin irritation and chronic wound. She is here for first visits to take the plate and lateral screws out.  On exam the suture line looks good service sutures. There still some slight redness but no gross infection. There is slight dehiscence though. I will have her claim this area daily without soapy water. She still plays mupirocin ointment on the wound and I'll have her on 2 weeks of doxycycline. I will like to see her back for wound check in 2 weeks.

## 2016-12-10 MED FILL — PRAVASTATIN NA 40 MG TAB: 40 | 30 days supply | Qty: 30 | Fill #1

## 2016-12-10 MED FILL — LEVOTHYROXINE 88 MCG TABLET: 88 | 30 days supply | Qty: 34 | Fill #5

## 2016-12-17 ENCOUNTER — Ambulatory Visit (INDEPENDENT_AMBULATORY_CARE_PROVIDER_SITE_OTHER): Payer: PPO | Admitting: Physician Assistant

## 2016-12-17 DIAGNOSIS — Z9889 Other specified postprocedural states: Secondary | ICD-10-CM

## 2016-12-17 NOTE — Progress Notes (Signed)
Kimberly Johns returns approximately 4 weeks status post removal of symptomatic prominent hardware left ankle lateral malleolus. She overall is doing well. She is here to evaluate the wound where she has slight dehiscence. She's been applying Bactroban to the area. She is now finished her doxycycline. She's had no fevers or chills.  Physical exam left ankle: Area of dehiscence with scab over the area. There is no drainage no signs of infection. Slight edema lateral ankle nonpitting.  Plan: She'll continue to wash the area with elective antibacterial soap daily. Apply a small thin layer of Bactroban over the scaphoid. See her back in 4 weeks to check the wound sooner if there is any questions or concerns.

## 2016-12-18 MED FILL — CELECOXIB 200 MG CAPS: 200 | 30 days supply | Qty: 60 | Fill #2

## 2016-12-23 MED FILL — PREMPRO 0.3 MG-1.5 MG TAB: 0.3-1.5 | 84 days supply | Qty: 84 | Fill #0

## 2017-01-07 MED FILL — LEVOTHYROXINE 88 MCG TABLET: 88 | 78 days supply | Qty: 90 | Fill #0

## 2017-01-07 MED FILL — PRAVASTATIN NA 40 MG TAB: 40 | 30 days supply | Qty: 30 | Fill #0

## 2017-01-12 ENCOUNTER — Ambulatory Visit (INDEPENDENT_AMBULATORY_CARE_PROVIDER_SITE_OTHER): Payer: PPO | Admitting: Orthopaedic Surgery

## 2017-01-12 DIAGNOSIS — Z96642 Presence of left artificial hip joint: Secondary | ICD-10-CM | POA: Diagnosis not present

## 2017-01-12 DIAGNOSIS — Z96641 Presence of right artificial hip joint: Secondary | ICD-10-CM | POA: Diagnosis not present

## 2017-01-12 DIAGNOSIS — Z9889 Other specified postprocedural states: Secondary | ICD-10-CM | POA: Diagnosis not present

## 2017-01-12 NOTE — Progress Notes (Signed)
Ms. Kimberly Johns is doing great now. She 76 years old and she is 7 months out from a left total hip arthroplasty in 2 years out from her right total hip arthroplasty. Recently though she is return for follow-up for were removed chronic retained hardware from her left ankle lateral malleolus. She had a retained screw was causing ankle issues with chronic wound. She says since then this is healed up greatly she has no problems he feels much better. She's looking for to ski season.  On examination both her hips move fluidly. She is walking without a limp and not using assistive device. Her lateral ankle incision is healed completely and the chronic wound is done great in terms of healing.  At this point we don't need to see her back for 6 months. She's having any issues she'll let us know. At that visit I would just like a low AP pelvis.

## 2017-02-05 MED FILL — PRAVASTATIN NA 40 MG TAB: 40 | 30 days supply | Qty: 30 | Fill #0

## 2017-02-23 ENCOUNTER — Other Ambulatory Visit (INDEPENDENT_AMBULATORY_CARE_PROVIDER_SITE_OTHER): Payer: Self-pay | Admitting: Orthopaedic Surgery

## 2017-02-23 MED FILL — CELECOXIB 200 MG CAPS: 200 | 30 days supply | Qty: 60 | Fill #0

## 2017-02-23 NOTE — Telephone Encounter (Signed)
Please advise 

## 2017-02-24 MED FILL — OMEPRAZOLE DR 40 MG CAPSULE: 40 | 90 days supply | Qty: 90 | Fill #0

## 2017-03-09 MED FILL — PRAVASTATIN NA 40 MG TAB: 40 | 30 days supply | Qty: 30 | Fill #1

## 2017-03-17 MED FILL — PREMPRO 0.3 MG-1.5 MG TAB: 0.3-1.5 | 84 days supply | Qty: 84 | Fill #1

## 2017-03-23 MED FILL — LEVOTHYROXINE 88 MCG TABLET: 88 | 78 days supply | Qty: 90 | Fill #1

## 2017-04-14 MED FILL — PRAVASTATIN NA 40 MG TAB: 40 | 30 days supply | Qty: 30 | Fill #2

## 2017-04-20 MED FILL — CELECOXIB 200 MG CAPS: 200 | 30 days supply | Qty: 60 | Fill #1

## 2017-04-22 MED FILL — PROPRANOLOL 40 MG TABLET: 40 | 15 days supply | Qty: 30 | Fill #0

## 2017-04-23 DIAGNOSIS — H2513 Age-related nuclear cataract, bilateral: Secondary | ICD-10-CM | POA: Diagnosis not present

## 2017-04-23 DIAGNOSIS — H25013 Cortical age-related cataract, bilateral: Secondary | ICD-10-CM | POA: Diagnosis not present

## 2017-04-23 DIAGNOSIS — H35372 Puckering of macula, left eye: Secondary | ICD-10-CM | POA: Diagnosis not present

## 2017-04-23 DIAGNOSIS — H35033 Hypertensive retinopathy, bilateral: Secondary | ICD-10-CM | POA: Diagnosis not present

## 2017-04-24 MED FILL — PROPRANOLOL HCL 10 MG TAB: 10 | 30 days supply | Qty: 30 | Fill #0

## 2017-05-12 MED FILL — PRAVASTATIN NA 40 MG TAB: 40 | 30 days supply | Qty: 30 | Fill #3

## 2017-05-26 MED FILL — OMEPRAZOLE DR 40 MG CAPSULE: 40 | 90 days supply | Qty: 90 | Fill #1

## 2017-05-31 MED FILL — PROPRANOLOL HCL 10 MG TAB: 10 | 30 days supply | Qty: 30 | Fill #1

## 2017-06-16 MED FILL — PREMPRO 0.3 MG-1.5 MG TAB: 0.3-1.5 | 84 days supply | Qty: 84 | Fill #2

## 2017-06-16 MED FILL — PRAVASTATIN NA 40 MG TAB: 40 | 30 days supply | Qty: 30 | Fill #4

## 2017-06-23 MED FILL — LEVOTHYROXINE 88 MCG TABLET: 88 | 78 days supply | Qty: 90 | Fill #2

## 2017-07-02 MED FILL — CELECOXIB 200 MG CAPSULE: 200 | 30 days supply | Qty: 60 | Fill #2

## 2017-07-13 ENCOUNTER — Ambulatory Visit (INDEPENDENT_AMBULATORY_CARE_PROVIDER_SITE_OTHER): Payer: PPO

## 2017-07-13 ENCOUNTER — Encounter (INDEPENDENT_AMBULATORY_CARE_PROVIDER_SITE_OTHER): Payer: Self-pay | Admitting: Orthopaedic Surgery

## 2017-07-13 ENCOUNTER — Ambulatory Visit (INDEPENDENT_AMBULATORY_CARE_PROVIDER_SITE_OTHER): Payer: PPO | Admitting: Orthopaedic Surgery

## 2017-07-13 DIAGNOSIS — Z96641 Presence of right artificial hip joint: Secondary | ICD-10-CM

## 2017-07-13 DIAGNOSIS — Z96642 Presence of left artificial hip joint: Secondary | ICD-10-CM | POA: Diagnosis not present

## 2017-07-13 NOTE — Progress Notes (Signed)
The patient is a year status post a left total hip arthroplasty and 2 years status post right total hip arthroplasty.  She is [age] years old and avid skier and had a good ski season this year.  She has no complaints.  We have also remotely removed hardware from her left ankle.  She says her ski boots that are nicely now because of that.  She has no complaints at all as it relates to her hips or her ankle.  She walks without assistive device and has good mobility.  Range of motion of both her hips is full.  She has no pain with extremes of motion.  He does not walk with a limp at all.  X-rays of her pelvis shows bilateral total hip arthroplasties with no complicating features.  At this point she will continue her activities as she tolerates.  She will follow-up as needed.  All questions concerns were answered and addressed.

## 2017-07-18 MED FILL — PRAVASTATIN NA 40 MG TAB: 40 | 30 days supply | Qty: 30 | Fill #5

## 2017-08-13 MED FILL — PRAVASTATIN NA 40 MG TAB: 40 | 30 days supply | Qty: 30 | Fill #6

## 2017-08-18 MED FILL — PROPRANOLOL 10 MG TABLET: 10 | 30 days supply | Qty: 30 | Fill #2

## 2017-09-01 ENCOUNTER — Other Ambulatory Visit (INDEPENDENT_AMBULATORY_CARE_PROVIDER_SITE_OTHER): Payer: Self-pay | Admitting: Orthopaedic Surgery

## 2017-09-01 MED FILL — PROPRANOLOL 40 MG TABLET: 40 | 15 days supply | Qty: 30 | Fill #1

## 2017-09-01 MED FILL — CELECOXIB 200 MG CAPSULE: 200 | 30 days supply | Qty: 60 | Fill #0

## 2017-09-01 MED FILL — LEVOTHYROXINE 88 MCG TABLET: 88 | 78 days supply | Qty: 90 | Fill #3

## 2017-09-01 NOTE — Telephone Encounter (Signed)
Please advise 

## 2017-09-09 MED FILL — PRAVASTATIN NA 40 MG TAB: 40 | 30 days supply | Qty: 30 | Fill #7

## 2017-09-10 MED FILL — PROPRANOLOL 10 MG TABLET: 10 | 30 days supply | Qty: 30 | Fill #3

## 2017-09-10 MED FILL — PREMPRO 0.3 MG-1.5 MG TAB: 0.3-1.5 | 84 days supply | Qty: 84 | Fill #0

## 2017-09-30 DIAGNOSIS — Z01419 Encounter for gynecological examination (general) (routine) without abnormal findings: Secondary | ICD-10-CM | POA: Diagnosis not present

## 2017-09-30 DIAGNOSIS — Z6824 Body mass index (BMI) 24.0-24.9, adult: Secondary | ICD-10-CM | POA: Diagnosis not present

## 2017-09-30 DIAGNOSIS — Z1231 Encounter for screening mammogram for malignant neoplasm of breast: Secondary | ICD-10-CM | POA: Diagnosis not present

## 2017-10-07 MED FILL — TRIAMCINOLONE 0.1% OINTMENT: 0.1 | 10 days supply | Qty: 15 | Fill #0

## 2017-10-07 MED FILL — NYSTATIN 100,000 UNITS/GM O: 100000 | 5 days supply | Qty: 15 | Fill #0

## 2017-10-13 MED FILL — PRAVASTATIN NA 40 MG TAB: 40 | 30 days supply | Qty: 30 | Fill #8

## 2017-10-13 MED FILL — OMEPRAZOLE 40 MG CPDR: 40 | 90 days supply | Qty: 90 | Fill #2

## 2017-10-30 MED FILL — CELECOXIB 200 MG CAPSULE: 200 | 30 days supply | Qty: 60 | Fill #1

## 2017-10-30 MED FILL — PROPRANOLOL 10 MG TABLET: 10 | 30 days supply | Qty: 30 | Fill #4

## 2017-11-09 DIAGNOSIS — Z0000 Encounter for general adult medical examination without abnormal findings: Secondary | ICD-10-CM | POA: Diagnosis not present

## 2017-11-09 DIAGNOSIS — Z5181 Encounter for therapeutic drug level monitoring: Secondary | ICD-10-CM | POA: Diagnosis not present

## 2017-11-09 DIAGNOSIS — E785 Hyperlipidemia, unspecified: Secondary | ICD-10-CM | POA: Diagnosis not present

## 2017-11-09 DIAGNOSIS — E039 Hypothyroidism, unspecified: Secondary | ICD-10-CM | POA: Diagnosis not present

## 2017-11-09 DIAGNOSIS — M8580 Other specified disorders of bone density and structure, unspecified site: Secondary | ICD-10-CM | POA: Diagnosis not present

## 2017-11-09 DIAGNOSIS — I700 Atherosclerosis of aorta: Secondary | ICD-10-CM | POA: Diagnosis not present

## 2017-11-19 MED FILL — PRAVASTATIN NA 40 MG TAB: 40 | 30 days supply | Qty: 30 | Fill #9

## 2017-11-20 MED FILL — LEVOTHYROXINE 88 MCG TABLET: 88 | 78 days supply | Qty: 90 | Fill #0

## 2017-12-05 MED FILL — PROPRANOLOL 40 MG TABLET: 40 | 15 days supply | Qty: 30 | Fill #2

## 2017-12-07 MED FILL — PREMPRO 0.3 MG-1.5 MG TAB: 0.3-1.5 | 84 days supply | Qty: 84 | Fill #0

## 2017-12-07 MED FILL — PROPRANOLOL 10 MG TABLET: 10 | 30 days supply | Qty: 30 | Fill #5

## 2017-12-07 MED FILL — PRAVASTATIN NA 40 MG TAB: 40 | 30 days supply | Qty: 30 | Fill #10

## 2017-12-28 DIAGNOSIS — N390 Urinary tract infection, site not specified: Secondary | ICD-10-CM | POA: Diagnosis not present

## 2017-12-28 DIAGNOSIS — R300 Dysuria: Secondary | ICD-10-CM | POA: Diagnosis not present

## 2018-01-05 MED FILL — CELECOXIB 200 MG CAPSULE: 200 | 30 days supply | Qty: 60 | Fill #2

## 2018-01-13 DIAGNOSIS — Z23 Encounter for immunization: Secondary | ICD-10-CM | POA: Diagnosis not present

## 2018-01-13 DIAGNOSIS — N390 Urinary tract infection, site not specified: Secondary | ICD-10-CM | POA: Diagnosis not present

## 2018-01-19 MED FILL — PROPRANOLOL 10 MG TABLET: 10 | 30 days supply | Qty: 30 | Fill #0

## 2018-01-19 MED FILL — PRAVASTATIN NA 40 MG TAB: 40 | 30 days supply | Qty: 30 | Fill #0

## 2018-01-23 DIAGNOSIS — R300 Dysuria: Secondary | ICD-10-CM | POA: Diagnosis not present

## 2018-01-23 DIAGNOSIS — N390 Urinary tract infection, site not specified: Secondary | ICD-10-CM | POA: Diagnosis not present

## 2018-02-14 MED FILL — LEVOTHYROXINE 88 MCG TABLET: 88 | 78 days supply | Qty: 90 | Fill #1

## 2018-02-15 MED FILL — PROPRANOLOL 10 MG TABLET: 10 | 30 days supply | Qty: 30 | Fill #1

## 2018-02-15 MED FILL — PRAVASTATIN NA 40 MG TAB: 40 | 30 days supply | Qty: 30 | Fill #1

## 2018-02-16 DIAGNOSIS — R399 Unspecified symptoms and signs involving the genitourinary system: Secondary | ICD-10-CM | POA: Diagnosis not present

## 2018-03-01 MED FILL — PREMPRO 0.3 MG-1.5 MG TAB: 0.3-1.5 | 84 days supply | Qty: 84 | Fill #1

## 2018-03-08 ENCOUNTER — Other Ambulatory Visit (INDEPENDENT_AMBULATORY_CARE_PROVIDER_SITE_OTHER): Payer: Self-pay | Admitting: Orthopaedic Surgery

## 2018-03-08 MED FILL — CELECOXIB 200 MG CAP: 200 | 30 days supply | Qty: 60 | Fill #0

## 2018-03-08 NOTE — Telephone Encounter (Signed)
Please advise 

## 2018-03-09 MED FILL — PROPRANOLOL 40 MG TABLET: 40 | 30 days supply | Qty: 30 | Fill #0

## 2018-03-23 MED FILL — PRAVASTATIN NA 40 MG TAB: 40 | 30 days supply | Qty: 30 | Fill #2

## 2018-03-23 MED FILL — OMEPRAZOLE 40 MG CPDR: 40 | 90 days supply | Qty: 90 | Fill #0

## 2018-04-15 DIAGNOSIS — N3946 Mixed incontinence: Secondary | ICD-10-CM | POA: Diagnosis not present

## 2018-04-15 DIAGNOSIS — R350 Frequency of micturition: Secondary | ICD-10-CM | POA: Diagnosis not present

## 2018-04-15 DIAGNOSIS — R351 Nocturia: Secondary | ICD-10-CM | POA: Diagnosis not present

## 2018-04-20 DIAGNOSIS — R350 Frequency of micturition: Secondary | ICD-10-CM | POA: Diagnosis not present

## 2018-04-20 DIAGNOSIS — N3946 Mixed incontinence: Secondary | ICD-10-CM | POA: Diagnosis not present

## 2018-04-20 DIAGNOSIS — N390 Urinary tract infection, site not specified: Secondary | ICD-10-CM | POA: Diagnosis not present

## 2018-04-20 MED FILL — NITROFURANTOIN MCR 100 MG C: 100 | 7 days supply | Qty: 14 | Fill #0

## 2018-04-20 MED FILL — PRAVASTATIN NA 40 MG TAB: 40 | 30 days supply | Qty: 30 | Fill #3

## 2018-04-22 MED FILL — PROPRANOLOL 10 MG TABLET: 10 | 30 days supply | Qty: 30 | Fill #2

## 2018-04-23 MED FILL — TRIMETHOPRIM 100 MG TABLET: 100 | 30 days supply | Qty: 30 | Fill #0

## 2018-05-13 MED FILL — CELECOXIB 200 MG CAP: 200 | 30 days supply | Qty: 60 | Fill #1

## 2018-05-17 DIAGNOSIS — H25013 Cortical age-related cataract, bilateral: Secondary | ICD-10-CM | POA: Diagnosis not present

## 2018-05-17 DIAGNOSIS — H35372 Puckering of macula, left eye: Secondary | ICD-10-CM | POA: Diagnosis not present

## 2018-05-17 DIAGNOSIS — H35033 Hypertensive retinopathy, bilateral: Secondary | ICD-10-CM | POA: Diagnosis not present

## 2018-05-17 DIAGNOSIS — H2513 Age-related nuclear cataract, bilateral: Secondary | ICD-10-CM | POA: Diagnosis not present

## 2018-05-24 MED FILL — PROPRANOLOL 10 MG TABLET: 10 | 30 days supply | Qty: 30 | Fill #3

## 2018-05-24 MED FILL — PRAVASTATIN NA 40 MG TAB: 40 | 30 days supply | Qty: 30 | Fill #4

## 2018-05-25 MED FILL — PREMPRO 0.3 MG-1.5 MG TAB: 0.3-1.5 | 84 days supply | Qty: 84 | Fill #2

## 2018-05-28 DIAGNOSIS — N3946 Mixed incontinence: Secondary | ICD-10-CM | POA: Diagnosis not present

## 2018-05-28 DIAGNOSIS — R350 Frequency of micturition: Secondary | ICD-10-CM | POA: Diagnosis not present

## 2018-05-28 MED FILL — TRIMETHOPRIM 100 MG TABLET: 100 | 30 days supply | Qty: 30 | Fill #0

## 2018-06-07 ENCOUNTER — Other Ambulatory Visit: Payer: Self-pay

## 2018-06-07 ENCOUNTER — Emergency Department (HOSPITAL_COMMUNITY)
Admission: EM | Admit: 2018-06-07 | Discharge: 2018-06-07 | Disposition: A | Discharge status: Home / Self Care | Payer: PPO | Attending: Emergency Medicine | Admitting: Emergency Medicine

## 2018-06-07 ENCOUNTER — Encounter (HOSPITAL_COMMUNITY): Payer: Self-pay

## 2018-06-07 ENCOUNTER — Emergency Department (HOSPITAL_COMMUNITY): Payer: PPO

## 2018-06-07 DIAGNOSIS — Y999 Unspecified external cause status: Secondary | ICD-10-CM | POA: Diagnosis not present

## 2018-06-07 DIAGNOSIS — Y9389 Activity, other specified: Secondary | ICD-10-CM | POA: Insufficient documentation

## 2018-06-07 DIAGNOSIS — E039 Hypothyroidism, unspecified: Secondary | ICD-10-CM | POA: Insufficient documentation

## 2018-06-07 DIAGNOSIS — Z79899 Other long term (current) drug therapy: Secondary | ICD-10-CM | POA: Insufficient documentation

## 2018-06-07 DIAGNOSIS — S43014A Anterior dislocation of right humerus, initial encounter: Secondary | ICD-10-CM | POA: Diagnosis not present

## 2018-06-07 DIAGNOSIS — Y92007 Garden or yard of unspecified non-institutional (private) residence as the place of occurrence of the external cause: Secondary | ICD-10-CM | POA: Insufficient documentation

## 2018-06-07 DIAGNOSIS — W172XXA Fall into hole, initial encounter: Secondary | ICD-10-CM | POA: Insufficient documentation

## 2018-06-07 DIAGNOSIS — Z87891 Personal history of nicotine dependence: Secondary | ICD-10-CM | POA: Insufficient documentation

## 2018-06-07 DIAGNOSIS — S4991XA Unspecified injury of right shoulder and upper arm, initial encounter: Secondary | ICD-10-CM | POA: Diagnosis present

## 2018-06-07 DIAGNOSIS — S43004A Unspecified dislocation of right shoulder joint, initial encounter: Secondary | ICD-10-CM | POA: Diagnosis not present

## 2018-06-07 MED ORDER — PROPOFOL 10 MG/ML IV BOLUS
INTRAVENOUS | Status: AC | PRN
  Administered 2018-06-07: 10 mg via INTRAVENOUS
  Administered 2018-06-07: 20 mg via INTRAVENOUS
  Administered 2018-06-07 (×5): 10 mg via INTRAVENOUS
  Administered 2018-06-07: 20 mg via INTRAVENOUS
  Administered 2018-06-07: 10 mg via INTRAVENOUS
  Administered 2018-06-07: 70 mg via INTRAVENOUS

## 2018-06-07 MED ORDER — PROPOFOL 10 MG/ML IV BOLUS
INTRAVENOUS | Status: AC | PRN
  Administered 2018-06-07: 30 mg via INTRAVENOUS
  Administered 2018-06-07: 10 mg via INTRAVENOUS
  Administered 2018-06-07: 80 mg via INTRAVENOUS

## 2018-06-07 MED ORDER — PROPOFOL 10 MG/ML IV BOLUS
0.5000 mg/kg | Freq: Once | INTRAVENOUS | Status: AC
  Administered 2018-06-07: 200 mg via INTRAVENOUS
  Filled 2018-06-07: qty 20

## 2018-06-07 MED ORDER — FENTANYL CITRATE (PF) 100 MCG/2ML IJ SOLN
100.0000 ug | Freq: Once | INTRAMUSCULAR | Status: AC
  Administered 2018-06-07: 100 ug via INTRAVENOUS
  Filled 2018-06-07: qty 2

## 2018-06-07 MED ORDER — PROPOFOL 10 MG/ML IV BOLUS
0.5000 mg/kg | Freq: Once | INTRAVENOUS | Status: AC
  Administered 2018-06-07: 120 mg via INTRAVENOUS
  Filled 2018-06-07: qty 20

## 2018-06-07 MED ORDER — ONDANSETRON HCL 4 MG/2ML IJ SOLN
4.0000 mg | Freq: Once | INTRAMUSCULAR | Status: AC
  Administered 2018-06-07: 4 mg via INTRAVENOUS
  Filled 2018-06-07: qty 2

## 2018-06-07 NOTE — ED Notes (Signed)
Sling applied to rt arm

## 2018-06-07 NOTE — ED Notes (Addendum)
Dr. Malon KindleSteven Johns at bedside explaining POC to pt

## 2018-06-07 NOTE — ED Triage Notes (Signed)
Patient states she fell into a sink hole approx 30 minutes ago. Patient has right shoulder pain.

## 2018-06-07 NOTE — Sedation Documentation (Signed)
Second sedation is needed due to difficult reduction. Orthopedic specialist has been consulted and will come to bedside to perform second right shoulder reduction attempt with conscious sedation.  Pt is alert at this time.

## 2018-06-07 NOTE — ED Notes (Signed)
Madilyn Hook MD and Lillia Abed PA attempted to reduce shoulder after administration of fentanyl. Unsuccessful. Will set up for procedure sedation

## 2018-06-07 NOTE — Consult Note (Signed)
Reason for Consult:Right shoulder dislocation Referring Physician: EDP  Terese DoorGail E Vandergrift is an 78 y.o. female.  HPI: Active 78 yo female who presents with a right anterior shoulder dislocation after falling in a hole on her property.  She is not sure of the position of her arm when she fell but was unable to move it after.  She is complaining of some numbness in the right hand.  I was asked to evaluate and assist in treatment by the ED after unsuccessful attempts to reduce in the ED.  Past Medical History:  Diagnosis Date  . Arthritis   . Colon polyps   . Diverticulosis   . Fracture of ankle, trimalleolar, left, closed   . Gallbladder disease   . GERD (gastroesophageal reflux disease)   . History of chicken pox   . HLD (hyperlipidemia)   . Hypothyroidism   . Kidney cysts   . Liver cyst   . Measles    hx of   . Mumps    hx of   . Shingles    hx of     Past Surgical History:  Procedure Laterality Date  . ANKLE FRACTURE SURGERY Left 2009  . ANTERIOR CRUCIATE LIGAMENT REPAIR Left 2001  . APPENDECTOMY  2010  . COLONOSCOPY WITH PROPOFOL N/A 10/30/2014   Procedure: COLONOSCOPY WITH PROPOFOL;  Surgeon: Charolett BumpersMartin K Johnson, MD;  Location: [facility];  Service: Endoscopy;  Laterality: N/A;  . TONSILLECTOMY     age 89  . TONSILLECTOMY    . TOTAL HIP ARTHROPLASTY Right 12/08/2014   Procedure: RIGHT TOTAL HIP ARTHROPLASTY ANTERIOR APPROACH;  Surgeon: Kathryne Hitchhristopher Y Blackman, MD;  Location: [facility];  Service: Orthopedics;  Laterality: Right;  . TOTAL HIP ARTHROPLASTY Left 06/20/2016   Procedure: LEFT TOTAL HIP ARTHROPLASTY ANTERIOR APPROACH;  Surgeon: Kathryne Hitchhristopher Y Blackman, MD;  Location: [facility];  Service: Orthopedics;  Laterality: Left;    Family History  Problem Relation Age of Onset  . Alzheimer's disease Mother [id number]  . Colon cancer Neg Hx   . Esophageal cancer Neg Hx   . Rectal cancer Neg Hx   . Stomach cancer Neg Hx     Social History:  reports that she quit smoking about 49 years  ago. Her smoking use included cigarettes. She has a 3.00 pack-year smoking history. She has never used smokeless tobacco. She reports that she does not drink alcohol or use drugs.  Allergies:  Allergies  Allergen Reactions  . Vancomycin Rash    Medications: I have reviewed the patient's current medications.  No results found for this or any previous visit (from the past 48 hour(s)).  Dg Shoulder Right  Result Date: 06/07/2018 CLINICAL DATA:  78 year old female status post fall in front yd today with right shoulder pain and deformity. EXAM: RIGHT SHOULDER - 2+ VIEW COMPARISON:  None. FINDINGS: Anterior, subcoracoid right glenohumeral dislocation. No proximal right humerus or scapula fracture identified. The right clavicle appears intact with some degenerative changes at the [facility] joint. Negative visible right chest. IMPRESSION: Anteriorly dislocated right glenohumeral joint. No acute fracture identified. Electronically Signed   By: Odessa FlemingH  Hall M.D.   On: 06/07/2018 18:23    ROS Blood pressure (!) 150/73, pulse 69, temperature (!) 97.4 F (36.3 C), temperature source Oral, resp. rate 20, height 5\' 5"  (1.651 m), weight 66.2 kg, SpO2 100 %. Physical Exam AAO, moderate distress, no pain with AROM of the neck Left shoulder and arm with normal AROM with no pain, 5/5 motor strength,  Right  LE and Left LE with pain free AROM, NVI distally Right UE with decreased EPL strength distally. Unable to move the shoulder which is internally rotated  Good distal pulses  Assessment/Plan: Right anterior inferior shoulder dislocation.  Recommend closed reduction under sedation in the ED. Reduction accomplished and post procedure XRAYs AP and LAT show successful reduction and presence of Hill Sachs lesion.  Recommend ice to the shoulder and immobilization in shoulder immobilizer. Follow up with me next week in the office (Tuesday or Wednesday)  Smokey Melott,STEVEN R 06/07/2018, 9:07 PM

## 2018-06-07 NOTE — ED Provider Notes (Signed)
[facility]-EMERGENCY DEPT Provider Note   CSN: [id number] Arrival date & time: 06/07/18  1718     History   Chief Complaint Chief Complaint  Patient presents with  . Fall  . Shoulder Injury    HPI Kimberly Johns is a 78 y.o. female past medical history of GERD, hyperlipidemia who presents for evaluation of right shoulder pain after mechanical fall and twisting cold that occurred approximately 30 minutes ago.  Patient reports that she has a syncopal outside near her septic tank.  She states that she missed stepped causing her to fall in the syncopal and landed on her right shoulder.  She had no preceding chest pain, dizziness.  Patient reports she not hit her head or lose consciousness.  Patient states that she was able to get up and walk back to the house.  She reports since then, she has had pain to the right shoulder.  She reports worsening pain when trying to move the shoulder.  Patient reports taking 1 oxycodone prior to ED arrival which helped with the pain.  Patient does report she has some slight numbness that starts extending distally.  Patient denies any neck pain, back pain, bilateral lower extremity pain.  The history is provided by the patient.    Past Medical History:  Diagnosis Date  . Arthritis   . Colon polyps   . Diverticulosis   . Fracture of ankle, trimalleolar, left, closed   . Gallbladder disease   . GERD (gastroesophageal reflux disease)   . History of chicken pox   . HLD (hyperlipidemia)   . Hypothyroidism   . Kidney cysts   . Liver cyst   . Measles    hx of   . Mumps    hx of   . Shingles    hx of     Patient Active Problem List   Diagnosis Date Noted  . History of removal of retained hardware 12/17/2016  . Unilateral primary osteoarthritis, left hip 06/20/2016  . Status post left hip replacement 06/20/2016  . Osteoarthritis of right hip 12/08/2014  . Status post total replacement of right hip 12/08/2014  . Abdominal pain,  epigastric 07/21/2012  . Esophageal reflux 07/21/2012  . Personal history of colonic polyps 07/21/2012    Past Surgical History:  Procedure Laterality Date  . ANKLE FRACTURE SURGERY Left 2009  . ANTERIOR CRUCIATE LIGAMENT REPAIR Left 2001  . APPENDECTOMY  2010  . COLONOSCOPY WITH PROPOFOL N/A 10/30/2014   Procedure: COLONOSCOPY WITH PROPOFOL;  Surgeon: Charolett Bumpers, MD;  Location: [facility];  Service: Endoscopy;  Laterality: N/A;  . TONSILLECTOMY     age 4  . TONSILLECTOMY    . TOTAL HIP ARTHROPLASTY Right 12/08/2014   Procedure: RIGHT TOTAL HIP ARTHROPLASTY ANTERIOR APPROACH;  Surgeon: Kathryne Hitch, MD;  Location: [facility];  Service: Orthopedics;  Laterality: Right;  . TOTAL HIP ARTHROPLASTY Left 06/20/2016   Procedure: LEFT TOTAL HIP ARTHROPLASTY ANTERIOR APPROACH;  Surgeon: Kathryne Hitch, MD;  Location: [facility];  Service: Orthopedics;  Laterality: Left;     OB History   No obstetric history on file.      Home Medications    Prior to Admission medications   Medication Sig Start Date End Date Taking? Authorizing Provider  calcium-vitamin D (OSCAL WITH D) 500-200 MG-UNIT per tablet Take 1 tablet by mouth daily with breakfast.   Yes [provider]  celecoxib (CELEBREX) 200 MG capsule TAKE 1 CAPSULE BY MOUTH TWICE DAILY  03/08/18  Yes Kirtland Bouchardlark, Gilbert W, PA-C  estrogen, conjugated,-medroxyprogesterone (PREMPRO) 0.3-1.5 MG per tablet Take 1 tablet by mouth every evening.    Yes [provider]  Glucosa-Chondr-Na Chondr-MSM (GLUCOSAMINE-CHONDROITIN-MSM) [phone] MG TABS Take 1 tablet by mouth every evening.    Yes [provider]  levothyroxine (SYNTHROID, LEVOTHROID) 88 MCG tablet Take 88 mcg by mouth daily before breakfast.    Yes [provider]  Multiple Vitamins-Minerals (WOMENS MULTIVITAMIN PLUS) TABS Take 1 tablet by mouth every evening.    Yes [provider]  omeprazole (PRILOSEC) 40 MG capsule Take 40 mg by  mouth every morning.    Yes [provider]  pravastatin (PRAVACHOL) 40 MG tablet Take 40 mg by mouth every evening.    Yes [provider]  propranolol (INDERAL) 10 MG tablet Take 10 mg by mouth daily as needed for anxiety. 05/24/18  Yes [provider]  Psyllium (METAMUCIL) 30.9 % POWD Take 1 packet by mouth at bedtime.    Yes [provider]  trimethoprim (TRIMPEX) 100 MG tablet Take 100 mg by mouth daily. 05/28/18  Yes [provider]  doxycycline (VIBRA-TABS) 100 MG tablet Take 1 tablet (100 mg total) by mouth 2 (two) times daily. Patient not taking: Reported on 06/07/2018 12/01/16   Kathryne HitchBlackman, Christopher Y, MD    Family History Family History  Problem Relation Age of Onset  . Alzheimer's disease Mother 6663  . Colon cancer Neg Hx   . Esophageal cancer Neg Hx   . Rectal cancer Neg Hx   . Stomach cancer Neg Hx     Social History Social History   Tobacco Use  . Smoking status: Former Smoker    Packs/day: 0.50    Years: 6.00    Pack years: 3.00    Types: Cigarettes    Last attempt to quit: 10/22/1968    Years since quitting: 49.6  . Smokeless tobacco: Never Used  Substance Use Topics  . Alcohol use: No  . Drug use: No     Allergies   Vancomycin   Review of Systems Review of Systems  Constitutional: Negative for fever.  Respiratory: Negative for cough and shortness of breath.   Cardiovascular: Negative for chest pain.  Gastrointestinal: Negative for abdominal pain, nausea and vomiting.  Musculoskeletal: Negative for back pain and neck pain.       Right shoulder pain  Neurological: Positive for numbness. Negative for headaches.  All other systems reviewed and are negative.    Physical Exam Updated Vital Signs BP (!) 144/66   Pulse 77   Temp (!) 97.4 F (36.3 C) (Oral)   Resp (!) 21   Ht 5\' 5"  (1.651 m)   Wt 66.2 kg   SpO2 96%   BMI 24.30 kg/m   Physical Exam Vitals signs and nursing note reviewed.    Constitutional:      Appearance: She is well-developed.  HENT:     Head: Normocephalic and atraumatic.  Eyes:     General: No scleral icterus.       Right eye: No discharge.        Left eye: No discharge.     Conjunctiva/sclera: Conjunctivae normal.  Neck:     Musculoskeletal: Full passive range of motion without pain.     Comments: Full flexion/extension and lateral movement of neck fully intact. No bony midline tenderness. No deformities or crepitus.  Cardiovascular:     Rate and Rhythm: Normal rate and regular rhythm.  Pulses:          Radial pulses are 2+ on the right side and 2+ on the left side.  Pulmonary:     Effort: Pulmonary effort is normal.  Musculoskeletal:     Thoracic back: She exhibits no tenderness.     Lumbar back: She exhibits no tenderness.     Comments: Tenderness to palpation noted to right shoulder deformity noted.  No crepitus.  No bony tenderness noted to right elbow, right wrist, right hand.  No tenderness palpation noted to left upper extremity.  No pelvic instability.  Full range motion of bilateral lower extremities without any difficulty.  No tenderness palpation noted to bilateral lower extremities.  Skin:    General: Skin is warm and dry.  Neurological:     Mental Status: She is alert.     Comments: Some subjective numbness noted to the distal right hand.  Equal grip strength bilaterally  Psychiatric:        Speech: Speech normal.        Behavior: Behavior normal.      ED Treatments / Results  Labs (all labs ordered are listed, but only abnormal results are displayed) Labs Reviewed - No data to display  EKG None  Radiology Dg Shoulder Right  Result Date: 06/07/2018 CLINICAL DATA:  78 year old female status post fall in front yd today with right shoulder pain and deformity. EXAM: RIGHT SHOULDER - 2+ VIEW COMPARISON:  None. FINDINGS: Anterior, subcoracoid right glenohumeral dislocation. No proximal right humerus or scapula fracture  identified. The right clavicle appears intact with some degenerative changes at the [facility] joint. Negative visible right chest. IMPRESSION: Anteriorly dislocated right glenohumeral joint. No acute fracture identified. Electronically Signed   By: Odessa Fleming M.D.   On: 06/07/2018 18:23   Dg Shoulder Right Portable  Result Date: 06/07/2018 CLINICAL DATA:  Postreduction right shoulder EXAM: PORTABLE RIGHT SHOULDER COMPARISON:  06/07/2018 FINDINGS: Anatomic position of the right shoulder postreduction. Cortical depression along the lateral humeral head consistent with Hill-Sachs fracture. There also appears to be irregularity of the inferior glenoid consistent with Bankart lesion although this is poorly visualized due to bone overlap. Coracoclavicular and acromioclavicular spaces are maintained. Soft tissues are unremarkable. No destructive or expansile bone lesions. IMPRESSION: Anatomic position of the right shoulder postreduction. Hill-Sachs and Bankart fractures are identified. Electronically Signed   By: Burman Nieves M.D.   On: 06/07/2018 21:21    Procedures Reduction of dislocation Date/Time: 06/07/2018 8:11 PM Performed by: Maxwell Caul, PA-C Authorized by: Maxwell Caul, PA-C  Unsuccessful attempt Consent: Verbal consent obtained. Risks and benefits: risks, benefits and alternatives were discussed Consent given by: patient Patient understanding: patient states understanding of the procedure being performed Patient consent: the patient's understanding of the procedure matches consent given Procedure consent: procedure consent matches procedure scheduled Relevant documents: relevant documents present and verified Test results: test results available and properly labeled Site marked: the operative site was marked Imaging studies: imaging studies available Required items: required blood products, implants, devices, and special equipment available Patient identity confirmed: verbally with  patient Time out: Immediately prior to procedure a "time out" was called to verify the correct patient, procedure, equipment, support staff and site/side marked as required. Preparation: Patient was prepped and draped in the usual sterile fashion.  Sedation: Patient sedated: Please see separate sedation note.  Comments: Attempted to reduce the dislocation with no success. EDP provider attempted again with no success.     (including critical care  time)  Medications Ordered in ED Medications  fentaNYL (SUBLIMAZE) injection 100 mcg (100 mcg Intravenous Given 06/07/18 1849)  ondansetron (ZOFRAN) injection 4 mg (4 mg Intravenous Given 06/07/18 1850)  propofol (DIPRIVAN) 10 mg/mL bolus/IV push 33.1 mg (200 mg Intravenous Given 06/07/18 1915)  propofol (DIPRIVAN) 10 mg/mL bolus/IV push (10 mg Intravenous Given 06/07/18 2003)  propofol (DIPRIVAN) 10 mg/mL bolus/IV push 33.1 mg (120 mg Intravenous Given 06/07/18 2058)  propofol (DIPRIVAN) 10 mg/mL bolus/IV push (30 mg Intravenous Given 06/07/18 2052)     Initial Impression / Assessment and Plan / ED Course  I have reviewed the triage vital signs and the nursing notes.  Pertinent labs & imaging results that were available during my care of the patient were reviewed by me and considered in my medical decision making (see chart for details).     78 year old female who presents for evaluation of right shoulder pain status post mechanical fall.  She reports she was walking with her dogs and states she fell and a syncopal and landed on her right shoulder.  No head injury, LOC.  She was able to get up and walk back to the house.  She drove herself here.  Reports pain to the right shoulder and difficulty moving it secondary to pain.  Patient does report that she has a little bit of numbness to the distal aspect of her hand.  Otherwise neurovascularly intact.  Tenderness palpation noted right shoulder.  There is notable deformity.  No crepitus.  Question fracture  versus dislocation.  Patient with no midline C, T, L-spine tenderness.  No indication for imaging.  Additionally, patient with no head injury, LOC.  X-ray shows anterior dislocated right shoulder.   Attempted to reduce shoulder here in the ED.  Attempts were unsuccessful.  Will discuss with Ortho.  Discussed with Dr. Ranell PatrickNorris (Ortho).  He will come evaluate the patient.  Patient successfully reduced by Dr. Devonne DoughtyNoris.  She was placed in a sling immobilizer.  Please see his note for further ED course.  Postreduction films show successful reduction of right shoulder.  Gust results with patient.  She reports improvement in pain.  Patient placed in sling immobilizer.  Instructed to follow-up with Ortho as directed. At this time, patient exhibits no emergent life-threatening condition that require further evaluation in ED or admission. Patient had ample opportunity for questions and discussion. All patient's questions were answered with full understanding. Strict return precautions discussed. Patient expresses understanding and agreement to plan.   Portions of this note were generated with Scientist, clinical (histocompatibility and immunogenetics)Dragon dictation software. Dictation errors may occur despite best attempts at proofreading.   Final Clinical Impressions(s) / ED Diagnoses   Final diagnoses:  Dislocation of right shoulder joint, initial encounter    ED Discharge Orders    None       Rosana HoesLayden,  A, PA-C 06/07/18 2323    Tilden Fossaees, Elizabeth, MD 06/11/18 1019

## 2018-06-07 NOTE — Sedation Documentation (Signed)
Shoulder is seemingly back in place. Pt is lethargic but arouses to painful stimuli. Confirmation X-ray will be ordered.

## 2018-06-07 NOTE — ED Notes (Signed)
Bed: WA20 Expected date:  Expected time:  Means of arrival:  Comments: Hold for triage 

## 2018-06-07 NOTE — Sedation Documentation (Signed)
Second TIMEOUT is performed at this time. Pt is able to verify identity, correct procedure, and need for procedural sedation at this time.   Dr. Tilden Fossa, JAARS PA, Dr. Malon Kindle, Shiva Sahagian Elige Radon RN, and Amy RRT at bedside.

## 2018-06-07 NOTE — ED Provider Notes (Signed)
.  Sedation Date/Time: 06/07/2018 11:17 PM Performed by: Tilden Fossa, MD Authorized by: Tilden Fossa, MD   Consent:    Consent obtained:  Verbal and written   Consent given by:  Patient   Risks discussed:  Allergic reaction, dysrhythmia, inadequate sedation, nausea, prolonged hypoxia resulting in organ damage, prolonged sedation necessitating reversal, respiratory compromise necessitating ventilatory assistance and intubation and vomiting   Alternatives discussed:  Analgesia without sedation, anxiolysis and regional anesthesia Universal protocol:    Procedure explained and questions answered to patient or proxy's satisfaction: yes     Relevant documents present and verified: yes     Test results available and properly labeled: yes     Imaging studies available: yes     Required blood products, implants, devices, and special equipment available: yes     Site/side marked: yes     Immediately prior to procedure a time out was called: yes     Patient identity confirmation method:  Verbally with patient Indications:    Procedure necessitating sedation performed by:  Physician performing sedation Pre-sedation assessment:    Time since last food or drink:  5   ASA classification: class 1 - normal, healthy patient     Neck mobility: normal     Mouth opening:  3 or more finger widths   Thyromental distance:  4 finger widths   Mallampati score:  I - soft palate, uvula, fauces, pillars visible   Pre-sedation assessments completed and reviewed: airway patency, cardiovascular function, hydration status, mental status, nausea/vomiting, pain level, respiratory function and temperature   Immediate pre-procedure details:    Reassessment: Patient reassessed immediately prior to procedure     Reviewed: vital signs, relevant labs/tests and NPO status     Verified: bag valve mask available, emergency equipment available, intubation equipment available, IV patency confirmed, oxygen available and suction  available   Procedure details (see MAR for exact dosages):    Preoxygenation:  Nasal cannula   Sedation:  Propofol   Intra-procedure monitoring:  Blood pressure monitoring, cardiac monitor, continuous pulse oximetry, frequent LOC assessments, frequent vital sign checks and continuous capnometry   Intra-procedure events: none     Total Provider sedation time (minutes):  20 Post-procedure details:    Post-sedation assessment completed:  06/07/2018 9:10 PM   Attendance: Constant attendance by certified staff until patient recovered     Recovery: Patient returned to pre-procedure baseline     Post-sedation assessments completed and reviewed: airway patency, cardiovascular function, hydration status, mental status, nausea/vomiting, pain level, respiratory function and temperature     Patient is stable for discharge or admission: yes     Patient tolerance:  Tolerated well, no immediate complications      Tilden Fossa, MD 06/07/18 2319

## 2018-06-07 NOTE — Discharge Instructions (Addendum)
Ice to the anterior shoulder area.  Keep your arm across your waist and your elbow in view. No reaching, pushing or pulling.  Use the sling at all times except bathing and then just hug yourself.  Follow up next Tuesday or Wed, call [phone] for appt  You can take Tylenol or Ibuprofen as directed for pain. You can alternate Tylenol and Ibuprofen every 4 hours. If you take Tylenol at 1pm, then you can take Ibuprofen at 5pm. Then you can take Tylenol again at 9pm.   Return to the emergency department for any worsening pain, redness or swelling of the arm, numbness/weakness or any other worsening or concerning symptoms.

## 2018-06-16 DIAGNOSIS — S43014A Anterior dislocation of right humerus, initial encounter: Secondary | ICD-10-CM | POA: Diagnosis not present

## 2018-06-16 DIAGNOSIS — M25511 Pain in right shoulder: Secondary | ICD-10-CM | POA: Diagnosis not present

## 2018-06-16 MED FILL — METHOCARBAMOL 500 MG TABLET: 500 | 10 days supply | Qty: 40 | Fill #0

## 2018-06-19 DIAGNOSIS — S43014A Anterior dislocation of right humerus, initial encounter: Secondary | ICD-10-CM | POA: Diagnosis not present

## 2018-06-22 MED FILL — PRAVASTATIN NA 40 MG TAB: 40 | 30 days supply | Qty: 30 | Fill #5 | Status: TO

## 2018-06-22 MED FILL — PROPRANOLOL 10 MG TABLET: 10 | 30 days supply | Qty: 30 | Fill #4

## 2018-06-24 DIAGNOSIS — M75101 Unspecified rotator cuff tear or rupture of right shoulder, not specified as traumatic: Secondary | ICD-10-CM | POA: Diagnosis not present

## 2018-06-24 DIAGNOSIS — S43014A Anterior dislocation of right humerus, initial encounter: Secondary | ICD-10-CM | POA: Diagnosis not present

## 2018-06-26 MED FILL — PROPRANOLOL 40 MG TABLET: 40 | 30 days supply | Qty: 30 | Fill #1 | Status: TO

## 2018-06-28 MED FILL — TRIMETHOPRIM 100 MG TABLET: 100 | 30 days supply | Qty: 30 | Fill #1 | Status: TO

## 2018-06-29 MED FILL — METHOCARBAMOL 500 MG TABLET: 500 | 10 days supply | Qty: 40 | Fill #0

## 2018-07-06 NOTE — H&P (Signed)
Patient's anticipated LOS is less than 2 midnights, meeting these requirements: - Younger than 77 - Lives within 1 hour of care - Has a competent adult at home to recover with post-op recover - NO history of  - Chronic pain requiring opiods  - Diabetes  - Coronary Artery Disease  - Heart failure  - Heart attack  - Stroke  - DVT/VTE  - Cardiac arrhythmia  - Respiratory Failure/COPD  - Renal failure  - Anemia  - Advanced Liver disease       Kimberly Johns is an 78 y.o. female.    Chief Complaint: right shoulder pain  HPI: Pt is a 78 y.o. female complaining of right shoulder pain for multiple years. Pain had continually increased since the beginning. X-rays in the clinic show end-stage arthritic changes of the right shoulder. Pt has tried various conservative treatments which have failed to alleviate their symptoms, including injections and therapy. Various options are discussed with the patient. Risks, benefits and expectations were discussed with the patient. Patient understand the risks, benefits and expectations and wishes to proceed with surgery.   PCP:  Shirlean Mylar, MD  D/C Plans: Home  PMH: Past Medical History:  Diagnosis Date  . Arthritis   . Colon polyps   . Diverticulosis   . Fracture of ankle, trimalleolar, left, closed   . Gallbladder disease   . GERD (gastroesophageal reflux disease)   . History of chicken pox   . HLD (hyperlipidemia)   . Hypothyroidism   . Kidney cysts   . Liver cyst   . Measles    hx of   . Mumps    hx of   . Shingles    hx of     PSH: Past Surgical History:  Procedure Laterality Date  . ANKLE FRACTURE SURGERY Left 2009  . ANTERIOR CRUCIATE LIGAMENT REPAIR Left 2001  . APPENDECTOMY  2010  . COLONOSCOPY WITH PROPOFOL N/A 10/30/2014   Procedure: COLONOSCOPY WITH PROPOFOL;  Surgeon: Charolett Bumpers, MD;  Location: [facility];  Service: Endoscopy;  Laterality: N/A;  . TONSILLECTOMY     age 35  . TONSILLECTOMY    . TOTAL HIP  ARTHROPLASTY Right 12/08/2014   Procedure: RIGHT TOTAL HIP ARTHROPLASTY ANTERIOR APPROACH;  Surgeon: Kathryne Hitch, MD;  Location: [facility];  Service: Orthopedics;  Laterality: Right;  . TOTAL HIP ARTHROPLASTY Left 06/20/2016   Procedure: LEFT TOTAL HIP ARTHROPLASTY ANTERIOR APPROACH;  Surgeon: Kathryne Hitch, MD;  Location: [facility];  Service: Orthopedics;  Laterality: Left;    Social History:  reports that she quit smoking about 49 years ago. Her smoking use included cigarettes. She has a 3.00 pack-year smoking history. She has never used smokeless tobacco. She reports that she does not drink alcohol or use drugs.  Allergies:  Allergies  Allergen Reactions  . Vancomycin Rash    Medications: No current facility-administered medications for this encounter.    Current Outpatient Medications  Medication Sig Dispense Refill  . calcium-vitamin D (OSCAL WITH D) 500-200 MG-UNIT per tablet Take 1 tablet by mouth daily with breakfast.    . celecoxib (CELEBREX) 200 MG capsule TAKE 1 CAPSULE BY MOUTH TWICE DAILY 60 capsule 2  . doxycycline (VIBRA-TABS) 100 MG tablet Take 1 tablet (100 mg total) by mouth 2 (two) times daily. (Patient not taking: Reported on 06/07/2018) 25 tablet 0  . estrogen, conjugated,-medroxyprogesterone (PREMPRO) 0.3-1.5 MG per tablet Take 1 tablet by mouth every evening.     Kandice Hams Chondr-MSM (  GLUCOSAMINE-CHONDROITIN-MSM) [phone] MG TABS Take 1 tablet by mouth every evening.     Marland Kitchen levothyroxine (SYNTHROID, LEVOTHROID) 88 MCG tablet Take 88 mcg by mouth daily before breakfast.     . Multiple Vitamins-Minerals (WOMENS MULTIVITAMIN PLUS) TABS Take 1 tablet by mouth every evening.     Marland Kitchen omeprazole (PRILOSEC) 40 MG capsule Take 40 mg by mouth every morning.     . pravastatin (PRAVACHOL) 40 MG tablet Take 40 mg by mouth every evening.     . propranolol (INDERAL) 10 MG tablet Take 10 mg by mouth daily as needed for anxiety.    . Psyllium (METAMUCIL) 30.9  % POWD Take 1 packet by mouth at bedtime.     Marland Kitchen trimethoprim (TRIMPEX) 100 MG tablet Take 100 mg by mouth daily.      No results found for this or any previous visit (from the past 48 hour(s)). No results found.  ROS: Pain with rom of the right upper extremity  Physical Exam: Alert and oriented 78 y.o. female in no acute distress Cranial nerves 2-12 intact Cervical spine: full rom with no tenderness, nv intact distally Chest: active breath sounds bilaterally, no wheeze rhonchi or rales Heart: regular rate and rhythm, no murmur Abd: non tender non distended with active bowel sounds Hip is stable with rom  Right shoulder with limited rom and weakness nv intact distally No rashes or edema  Assessment/Plan Assessment: right shoulder rotator cuff tear with underlying osteoarthritis  Plan:  Patient will undergo a right shoulder cuff repair vs reverse total by Dr. Ranell Patrick at [facility]. Risks benefits and expectations were discussed with the patient. Patient understand risks, benefits and expectations and wishes to proceed. Preoperative templating of the joint replacement has been completed, documented, and submitted to the Operating Room personnel in order to optimize intra-operative equipment management.   Alphonsa Overall PA-C, MPAS [facility] is now [employer] [address], [place], [place] [address] Phone: [phone] [email or web address] Facebook  [employer]

## 2018-07-07 MED FILL — OMEPRAZOLE 40 MG CPDR: 40 | 90 days supply | Qty: 90 | Fill #1 | Status: TO

## 2018-07-09 NOTE — Patient Instructions (Addendum)
NETRA VESSELS  07/09/2018   Your procedure is scheduled on: 07-16-2018   Report to [facility] Main  Entrance     Report to SHORT STAY at 5:30AM    Call this number if you have problems the morning of surgery [phone]      Remember: Do not eat food or drink liquids :After Midnight. BRUSH YOUR TEETH MORNING OF SURGERY AND RINSE YOUR MOUTH OUT, NO CHEWING GUM CANDY OR MINTS.     Take these medicines the morning of surgery with A SIP OF WATER: levothyroxine, omeprazole, tylenol if needed, propanol if needed                                 You may not have any metal on your body including hair pins and              piercings  Do not wear jewelry, make-up, lotions, powders or perfumes, deodorant             Do not wear nail polish.  Do not shave  48 hours prior to surgery.     Do not bring valuables to the hospital.  IS NOT             RESPONSIBLE   FOR VALUABLES.  Contacts, dentures or bridgework may not be worn into surgery.  Leave suitcase in the car. After surgery it may be brought to your room.                  Please read over the following fact sheets you were given: _____________________________________________________________________             [facility] - Preparing for Surgery Before surgery, you can play an important role.  Because skin is not sterile, your skin needs to be as free of germs as possible.  You can reduce the number of germs on your skin by washing with CHG (chlorahexidine gluconate) soap before surgery.  CHG is an antiseptic cleaner which kills germs and bonds with the skin to continue killing germs even after washing. Please DO NOT use if you have an allergy to CHG or antibacterial soaps.  If your skin becomes reddened/irritated stop using the CHG and inform your nurse when you arrive at Short Stay. Do not shave (including legs and underarms) for at least 48 hours prior to the first CHG shower.  You may shave  your face/neck. Please follow these instructions carefully:  1.  Shower with CHG Soap the night before surgery and the  morning of Surgery.  2.  If you choose to wash your hair, wash your hair first as usual with your  normal  shampoo.  3.  After you shampoo, rinse your hair and body thoroughly to remove the  shampoo.                           4.  Use CHG as you would any other liquid soap.  You can apply chg directly  to the skin and wash                       Gently with a scrungie or clean washcloth.  5.  Apply the CHG Soap to your body ONLY FROM THE NECK DOWN.  Do not use on face/ open                           Wound or open sores. Avoid contact with eyes, ears mouth and genitals (private parts).                       Wash face,  Genitals (private parts) with your normal soap.             6.  Wash thoroughly, paying special attention to the area where your surgery  will be performed.  7.  Thoroughly rinse your body with warm water from the neck down.  8.  DO NOT shower/wash with your normal soap after using and rinsing off  the CHG Soap.                9.  Pat yourself dry with a clean towel.            10.  Wear clean pajamas.            11.  Place clean sheets on your bed the night of your first shower and do not  sleep with pets. Day of Surgery : Do not apply any lotions/deodorants the morning of surgery.  Please wear clean clothes to the hospital/surgery center.  FAILURE TO FOLLOW THESE INSTRUCTIONS MAY RESULT IN THE CANCELLATION OF YOUR SURGERY PATIENT SIGNATURE_________________________________  NURSE SIGNATURE__________________________________  ________________________________________________________________________   Adam Phenix  An incentive spirometer is a tool that can help keep your lungs clear and active. This tool measures how well you are filling your lungs with each breath. Taking long deep breaths may help reverse or decrease the chance of developing  breathing (pulmonary) problems (especially infection) following:  A long period of time when you are unable to move or be active. BEFORE THE PROCEDURE   If the spirometer includes an indicator to show your best effort, your nurse or respiratory therapist will set it to a desired goal.  If possible, sit up straight or lean slightly forward. Try not to slouch.  Hold the incentive spirometer in an upright position. INSTRUCTIONS FOR USE  1. Sit on the edge of your bed if possible, or sit up as far as you can in bed or on a chair. 2. Hold the incentive spirometer in an upright position. 3. Breathe out normally. 4. Place the mouthpiece in your mouth and seal your lips tightly around it. 5. Breathe in slowly and as deeply as possible, raising the piston or the ball toward the top of the column. 6. Hold your breath for 3-5 seconds or for as long as possible. Allow the piston or ball to fall to the bottom of the column. 7. Remove the mouthpiece from your mouth and breathe out normally. 8. Rest for a few seconds and repeat Steps 1 through 7 at least 10 times every 1-2 hours when you are awake. Take your time and take a few normal breaths between deep breaths. 9. The spirometer may include an indicator to show your best effort. Use the indicator as a goal to work toward during each repetition. 10. After each set of 10 deep breaths, practice coughing to be sure your lungs are clear. If you have an incision (the cut made at the time of surgery), support your incision when coughing by placing a pillow or rolled up towels firmly against it. Once you are able to get out of  bed, walk around indoors and cough well. You may stop using the incentive spirometer when instructed by your caregiver.  RISKS AND COMPLICATIONS  Take your time so you do not get dizzy or light-headed.  If you are in pain, you may need to take or ask for pain medication before doing incentive spirometry. It is harder to take a deep  breath if you are having pain. AFTER USE  Rest and breathe slowly and easily.  It can be helpful to keep track of a log of your progress. Your caregiver can provide you with a simple table to help with this. If you are using the spirometer at home, follow these instructions: [place] IF:   You are having difficultly using the spirometer.  You have trouble using the spirometer as often as instructed.  Your pain medication is not giving enough relief while using the spirometer.  You develop fever of 100.5 F (38.1 C) or higher. SEEK IMMEDIATE MEDICAL CARE IF:   You cough up bloody sputum that had not been present before.  You develop fever of 102 F (38.9 C) or greater.  You develop worsening pain at or near the incision site. MAKE SURE YOU:   Understand these instructions.  Will watch your condition.  Will get help right away if you are not doing well or get worse. Document Released: 09/01/2006 Document Revised: 07/14/2011 Document Reviewed: 11/02/2006 [facility] Patient Information 2014 [place], [place].   ________________________________________________________________________

## 2018-07-12 ENCOUNTER — Encounter (HOSPITAL_COMMUNITY): Payer: Self-pay

## 2018-07-12 ENCOUNTER — Other Ambulatory Visit: Payer: Self-pay

## 2018-07-12 ENCOUNTER — Encounter (HOSPITAL_COMMUNITY)
Admission: RE | Admit: 2018-07-12 | Discharge: 2018-07-12 | Disposition: A | Discharge status: Home / Self Care | Payer: PPO | Source: Ambulatory Visit | Attending: Orthopedic Surgery | Admitting: Orthopedic Surgery

## 2018-07-12 DIAGNOSIS — Z01812 Encounter for preprocedural laboratory examination: Secondary | ICD-10-CM | POA: Diagnosis not present

## 2018-07-12 HISTORY — DX: Other specified postprocedural states: R11.2

## 2018-07-12 HISTORY — DX: Other specified postprocedural states: Z98.890

## 2018-07-12 HISTORY — DX: Anxiety disorder, unspecified: F41.9

## 2018-07-12 HISTORY — DX: Unspecified rotator cuff tear or rupture of unspecified shoulder, not specified as traumatic: M75.100

## 2018-07-12 LAB — BASIC METABOLIC PANEL
Anion gap: 9 (ref 5–15)
BUN: 22 mg/dL (ref 8–23)
CO2: 23 mmol/L (ref 22–32)
Calcium: 9.6 mg/dL (ref 8.9–10.3)
Chloride: 108 mmol/L (ref 98–111)
Creatinine, Ser: 0.87 mg/dL (ref 0.44–1.00)
GFR calc Af Amer: >60 mL/min (ref >60)
GFR calc non Af Amer: >60 mL/min (ref >60)
Glucose, Bld: 101 mg/dL — ABNORMAL HIGH (ref 70–99)
Potassium: 4.3 mmol/L (ref 3.5–5.1)
SODIUM: 140 mmol/L (ref 135–145)

## 2018-07-12 LAB — CBC
HEMATOCRIT: 40.9 % (ref 36.0–46.0)
Hemoglobin: 13 g/dL (ref 12.0–15.0)
MCH: 32.3 pg (ref 26.0–34.0)
MCHC: 31.8 g/dL (ref 30.0–36.0)
MCV: 101.7 fL — ABNORMAL HIGH (ref 80.0–100.0)
Platelets: 235 10*3/uL (ref 150–400)
RBC: 4.02 MIL/uL (ref 3.87–5.11)
RDW: 14.5 % (ref 11.5–15.5)
WBC: 6.6 10*3/uL (ref 4.0–10.5)
nRBC: 0 % (ref 0.0–0.2)

## 2018-07-15 NOTE — Anesthesia Preprocedure Evaluation (Signed)
Anesthesia Evaluation  Patient identified by MRN, date of birth, ID band Patient awake    Reviewed: Allergy & Precautions, H&P , NPO status , Patient's Chart, lab work & pertinent test results, reviewed documented beta blocker date and time   History of Anesthesia Complications (+) PONV and history of anesthetic complications  Airway Mallampati: II  TM Distance: >3 FB Neck ROM: full    Dental no notable dental hx. (+) Dental Advisory Given   Pulmonary former smoker,    Pulmonary exam normal breath sounds clear to auscultation       Cardiovascular Pt. on home beta blockers  Rhythm:regular Rate:Normal     Neuro/Psych    GI/Hepatic GERD  Controlled,  Endo/Other  Hypothyroidism   Renal/GU      Musculoskeletal  (+) Arthritis ,   Abdominal   Peds  Hematology   Anesthesia Other Findings   Reproductive/Obstetrics                             Anesthesia Physical  Anesthesia Plan  ASA: II  Anesthesia Plan: General   Post-op Pain Management: GA combined w/ Regional for post-op pain   Induction: Intravenous  PONV Risk Score and Plan: 3 and Ondansetron, Dexamethasone, Treatment may vary due to age or medical condition and Midazolam  Airway Management Planned: Oral ETT and LMA  Additional Equipment:   Intra-op Plan:   Post-operative Plan: Extubation in OR  Informed Consent: I have reviewed the patients History and Physical, chart, labs and discussed the procedure including the risks, benefits and alternatives for the proposed anesthesia with the patient or authorized representative who has indicated his/her understanding and acceptance.     Dental advisory given  Plan Discussed with: CRNA, Surgeon and Anesthesiologist  Anesthesia Plan Comments: (  )        Anesthesia Quick Evaluation

## 2018-07-16 ENCOUNTER — Inpatient Hospital Stay (HOSPITAL_COMMUNITY): Payer: PPO | Admitting: Physician Assistant

## 2018-07-16 ENCOUNTER — Inpatient Hospital Stay (HOSPITAL_COMMUNITY): Payer: PPO

## 2018-07-16 ENCOUNTER — Inpatient Hospital Stay (HOSPITAL_COMMUNITY): Payer: PPO | Admitting: Anesthesiology

## 2018-07-16 ENCOUNTER — Other Ambulatory Visit: Payer: Self-pay

## 2018-07-16 ENCOUNTER — Encounter (HOSPITAL_COMMUNITY): Payer: Self-pay | Admitting: Emergency Medicine

## 2018-07-16 ENCOUNTER — Encounter (HOSPITAL_COMMUNITY)
Admission: RE | Disposition: A | Discharge status: Home / Self Care | Payer: Self-pay | Source: Home / Self Care | Attending: Orthopedic Surgery

## 2018-07-16 ENCOUNTER — Inpatient Hospital Stay (HOSPITAL_COMMUNITY)
Admission: RE | Admit: 2018-07-16 | Discharge: 2018-07-17 | DRG: 483 | Disposition: A | Discharge status: Home / Self Care | Payer: PPO | Attending: Orthopedic Surgery | Admitting: Orthopedic Surgery

## 2018-07-16 DIAGNOSIS — Z8719 Personal history of other diseases of the digestive system: Secondary | ICD-10-CM

## 2018-07-16 DIAGNOSIS — Z792 Long term (current) use of antibiotics: Secondary | ICD-10-CM

## 2018-07-16 DIAGNOSIS — E039 Hypothyroidism, unspecified: Secondary | ICD-10-CM | POA: Diagnosis not present

## 2018-07-16 DIAGNOSIS — Z881 Allergy status to other antibiotic agents status: Secondary | ICD-10-CM

## 2018-07-16 DIAGNOSIS — M25511 Pain in right shoulder: Secondary | ICD-10-CM | POA: Diagnosis present

## 2018-07-16 DIAGNOSIS — Z96643 Presence of artificial hip joint, bilateral: Secondary | ICD-10-CM | POA: Diagnosis present

## 2018-07-16 DIAGNOSIS — M6289 Other specified disorders of muscle: Secondary | ICD-10-CM | POA: Diagnosis present

## 2018-07-16 DIAGNOSIS — Z791 Long term (current) use of non-steroidal anti-inflammatories (NSAID): Secondary | ICD-10-CM | POA: Diagnosis not present

## 2018-07-16 DIAGNOSIS — X58XXXS Exposure to other specified factors, sequela: Secondary | ICD-10-CM | POA: Diagnosis present

## 2018-07-16 DIAGNOSIS — M19111 Post-traumatic osteoarthritis, right shoulder: Secondary | ICD-10-CM | POA: Diagnosis not present

## 2018-07-16 DIAGNOSIS — Z79890 Hormone replacement therapy (postmenopausal): Secondary | ICD-10-CM

## 2018-07-16 DIAGNOSIS — K219 Gastro-esophageal reflux disease without esophagitis: Secondary | ICD-10-CM | POA: Diagnosis present

## 2018-07-16 DIAGNOSIS — M25811 Other specified joint disorders, right shoulder: Secondary | ICD-10-CM | POA: Diagnosis not present

## 2018-07-16 DIAGNOSIS — G8918 Other acute postprocedural pain: Secondary | ICD-10-CM | POA: Diagnosis not present

## 2018-07-16 DIAGNOSIS — Z471 Aftercare following joint replacement surgery: Secondary | ICD-10-CM | POA: Diagnosis not present

## 2018-07-16 DIAGNOSIS — S46011S Strain of muscle(s) and tendon(s) of the rotator cuff of right shoulder, sequela: Secondary | ICD-10-CM

## 2018-07-16 DIAGNOSIS — E785 Hyperlipidemia, unspecified: Secondary | ICD-10-CM | POA: Diagnosis not present

## 2018-07-16 DIAGNOSIS — M24811 Other specific joint derangements of right shoulder, not elsewhere classified: Secondary | ICD-10-CM | POA: Diagnosis not present

## 2018-07-16 DIAGNOSIS — S43004S Unspecified dislocation of right shoulder joint, sequela: Secondary | ICD-10-CM | POA: Diagnosis not present

## 2018-07-16 DIAGNOSIS — Z79899 Other long term (current) drug therapy: Secondary | ICD-10-CM

## 2018-07-16 DIAGNOSIS — Z87891 Personal history of nicotine dependence: Secondary | ICD-10-CM | POA: Diagnosis not present

## 2018-07-16 DIAGNOSIS — Z96611 Presence of right artificial shoulder joint: Secondary | ICD-10-CM | POA: Diagnosis not present

## 2018-07-16 DIAGNOSIS — M75101 Unspecified rotator cuff tear or rupture of right shoulder, not specified as traumatic: Secondary | ICD-10-CM | POA: Diagnosis not present

## 2018-07-16 HISTORY — PX: REVERSE SHOULDER ARTHROPLASTY: SHX5054

## 2018-07-16 HISTORY — PX: SHOULDER ARTHROSCOPY WITH ROTATOR CUFF REPAIR: SHX5685

## 2018-07-16 SURGERY — ARTHROSCOPY, SHOULDER, WITH ROTATOR CUFF REPAIR
Anesthesia: General | Site: Shoulder | Laterality: Right

## 2018-07-16 MED ORDER — PHENYLEPHRINE 40 MCG/ML (10ML) SYRINGE FOR IV PUSH (FOR BLOOD PRESSURE SUPPORT)
PREFILLED_SYRINGE | INTRAVENOUS | Status: AC
  Filled 2018-07-16: qty 10

## 2018-07-16 MED ORDER — ONDANSETRON HCL 4 MG/2ML IJ SOLN
INTRAMUSCULAR | Status: AC
  Filled 2018-07-16: qty 2

## 2018-07-16 MED ORDER — WOMENS MULTIVITAMIN PLUS PO TABS: 1.0000 | ORAL_TABLET | Freq: Every day | ORAL | Status: DC

## 2018-07-16 MED ORDER — 0.9 % SODIUM CHLORIDE (POUR BTL) OPTIME
TOPICAL | Status: DC | PRN
  Administered 2018-07-16: 1000 mL

## 2018-07-16 MED ORDER — ACETAMINOPHEN 325 MG PO TABS: 325.0000 mg | ORAL_TABLET | Freq: Four times a day (QID) | ORAL | Status: DC | PRN

## 2018-07-16 MED ORDER — SODIUM CHLORIDE 0.9 % IR SOLN
Status: DC | PRN
  Administered 2018-07-16: 6000 mL

## 2018-07-16 MED ORDER — BUPIVACAINE-EPINEPHRINE (PF) 0.25% -1:200000 IJ SOLN
INTRAMUSCULAR | Status: AC
  Filled 2018-07-16: qty 30

## 2018-07-16 MED ORDER — PROPOFOL 10 MG/ML IV BOLUS
INTRAVENOUS | Status: AC
  Filled 2018-07-16: qty 20

## 2018-07-16 MED ORDER — DEXAMETHASONE SODIUM PHOSPHATE 10 MG/ML IJ SOLN
INTRAMUSCULAR | Status: AC
  Filled 2018-07-16: qty 1

## 2018-07-16 MED ORDER — BUPIVACAINE-EPINEPHRINE (PF) 0.5% -1:200000 IJ SOLN
INTRAMUSCULAR | Status: DC | PRN
  Administered 2018-07-16: 25 mL via PERINEURAL

## 2018-07-16 MED ORDER — ADULT MULTIVITAMIN W/MINERALS CH
1.0000 | ORAL_TABLET | Freq: Every day | ORAL | Status: DC
  Administered 2018-07-16: 1 via ORAL
  Filled 2018-07-16: qty 1

## 2018-07-16 MED ORDER — HYDROCODONE-ACETAMINOPHEN 5-325 MG PO TABS: 1.0000 | ORAL_TABLET | Freq: Four times a day (QID) | ORAL | 0 refills | Status: DC | PRN

## 2018-07-16 MED ORDER — BUPIVACAINE LIPOSOME 1.3 % IJ SUSP
INTRAMUSCULAR | Status: DC | PRN
  Administered 2018-07-16: 10 mL via PERINEURAL

## 2018-07-16 MED ORDER — CALCIUM CARBONATE-VITAMIN D 600-400 MG-UNIT PO TABS: 1.0000 | ORAL_TABLET | Freq: Every day | ORAL | Status: DC

## 2018-07-16 MED ORDER — METHOCARBAMOL 500 MG PO TABS: 500.0000 mg | ORAL_TABLET | Freq: Three times a day (TID) | ORAL | 1 refills | Status: AC | PRN

## 2018-07-16 MED ORDER — PANTOPRAZOLE SODIUM 40 MG PO TBEC
40.0000 mg | DELAYED_RELEASE_TABLET | Freq: Every day | ORAL | Status: DC
  Administered 2018-07-17: 40 mg via ORAL
  Filled 2018-07-16: qty 1

## 2018-07-16 MED ORDER — POLYETHYLENE GLYCOL 3350 17 G PO PACK: 17.0000 g | PACK | Freq: Every day | ORAL | Status: DC | PRN

## 2018-07-16 MED ORDER — METOCLOPRAMIDE HCL 5 MG/ML IJ SOLN: 5.0000 mg | Freq: Three times a day (TID) | INTRAMUSCULAR | Status: DC | PRN

## 2018-07-16 MED ORDER — FENTANYL CITRATE (PF) 250 MCG/5ML IJ SOLN
INTRAMUSCULAR | Status: DC | PRN
  Administered 2018-07-16 (×2): 50 ug via INTRAVENOUS

## 2018-07-16 MED ORDER — SUGAMMADEX SODIUM 200 MG/2ML IV SOLN
INTRAVENOUS | Status: AC
  Filled 2018-07-16: qty 2

## 2018-07-16 MED ORDER — SODIUM CHLORIDE 0.9 % IV SOLN
INTRAVENOUS | Status: DC
  Administered 2018-07-16: 13:00:00 via INTRAVENOUS

## 2018-07-16 MED ORDER — BISACODYL 10 MG RE SUPP: 10.0000 mg | Freq: Every day | RECTAL | Status: DC | PRN

## 2018-07-16 MED ORDER — METOCLOPRAMIDE HCL 5 MG PO TABS: 5.0000 mg | ORAL_TABLET | Freq: Three times a day (TID) | ORAL | Status: DC | PRN

## 2018-07-16 MED ORDER — BUPIVACAINE-EPINEPHRINE (PF) 0.25% -1:200000 IJ SOLN
INTRAMUSCULAR | Status: DC | PRN
  Administered 2018-07-16: 10 mL

## 2018-07-16 MED ORDER — DOCUSATE SODIUM 100 MG PO CAPS
100.0000 mg | ORAL_CAPSULE | Freq: Two times a day (BID) | ORAL | Status: DC
  Administered 2018-07-17: 100 mg via ORAL
  Filled 2018-07-16: qty 1

## 2018-07-16 MED ORDER — FENTANYL CITRATE (PF) 100 MCG/2ML IJ SOLN: 25.0000 ug | INTRAMUSCULAR | Status: DC | PRN

## 2018-07-16 MED ORDER — CALCIUM CARBONATE-VITAMIN D 500-200 MG-UNIT PO TABS
1.0000 | ORAL_TABLET | Freq: Every day | ORAL | Status: DC
  Administered 2018-07-17: 1 via ORAL
  Filled 2018-07-16: qty 1

## 2018-07-16 MED ORDER — TRIMETHOPRIM 100 MG PO TABS
100.0000 mg | ORAL_TABLET | Freq: Every evening | ORAL | Status: DC
  Administered 2018-07-16: 100 mg via ORAL
  Filled 2018-07-16: qty 1

## 2018-07-16 MED ORDER — METHOCARBAMOL 500 MG PO TABS: 500.0000 mg | ORAL_TABLET | Freq: Three times a day (TID) | ORAL | Status: DC | PRN

## 2018-07-16 MED ORDER — MIDAZOLAM HCL 2 MG/2ML IJ SOLN
INTRAMUSCULAR | Status: DC | PRN
  Administered 2018-07-16: 1 mg via INTRAVENOUS

## 2018-07-16 MED ORDER — ACETAMINOPHEN 500 MG PO TABS: 500.0000 mg | ORAL_TABLET | Freq: Four times a day (QID) | ORAL | Status: DC | PRN

## 2018-07-16 MED ORDER — CEFAZOLIN SODIUM-DEXTROSE 2-4 GM/100ML-% IV SOLN
2.0000 g | Freq: Four times a day (QID) | INTRAVENOUS | Status: AC
  Administered 2018-07-16 – 2018-07-17 (×3): 2 g via INTRAVENOUS
  Filled 2018-07-16 (×4): qty 100

## 2018-07-16 MED ORDER — PROPRANOLOL HCL 10 MG PO TABS
10.0000 mg | ORAL_TABLET | Freq: Every day | ORAL | Status: DC | PRN
  Filled 2018-07-16: qty 1

## 2018-07-16 MED ORDER — EPHEDRINE SULFATE-NACL 50-0.9 MG/10ML-% IV SOSY
PREFILLED_SYRINGE | INTRAVENOUS | Status: DC | PRN
  Administered 2018-07-16 (×4): 5 mg via INTRAVENOUS
  Administered 2018-07-16: 7.5 mg via INTRAVENOUS

## 2018-07-16 MED ORDER — METHOCARBAMOL 500 MG PO TABS
500.0000 mg | ORAL_TABLET | Freq: Four times a day (QID) | ORAL | Status: DC | PRN
  Administered 2018-07-16 – 2018-07-17 (×3): 500 mg via ORAL
  Filled 2018-07-16 (×3): qty 1

## 2018-07-16 MED ORDER — HYDROCODONE-ACETAMINOPHEN 5-325 MG PO TABS
1.0000 | ORAL_TABLET | ORAL | Status: DC | PRN
  Administered 2018-07-16 – 2018-07-17 (×3): 1 via ORAL
  Filled 2018-07-16 (×3): qty 1

## 2018-07-16 MED ORDER — MEPERIDINE HCL 50 MG/ML IJ SOLN: 6.2500 mg | INTRAMUSCULAR | Status: DC | PRN

## 2018-07-16 MED ORDER — METHOCARBAMOL 500 MG IVPB - SIMPLE MED
500.0000 mg | Freq: Four times a day (QID) | INTRAVENOUS | Status: DC | PRN
  Filled 2018-07-16: qty 50

## 2018-07-16 MED ORDER — ROCURONIUM BROMIDE 10 MG/ML (PF) SYRINGE
PREFILLED_SYRINGE | INTRAVENOUS | Status: AC
  Filled 2018-07-16: qty 10

## 2018-07-16 MED ORDER — CELECOXIB 200 MG PO CAPS
200.0000 mg | ORAL_CAPSULE | Freq: Every day | ORAL | Status: DC
  Administered 2018-07-16 – 2018-07-17 (×2): 200 mg via ORAL
  Filled 2018-07-16 (×2): qty 1

## 2018-07-16 MED ORDER — DEXAMETHASONE SODIUM PHOSPHATE 10 MG/ML IJ SOLN
INTRAMUSCULAR | Status: DC | PRN
  Administered 2018-07-16: 10 mg via INTRAVENOUS

## 2018-07-16 MED ORDER — PRAVASTATIN SODIUM 20 MG PO TABS
40.0000 mg | ORAL_TABLET | Freq: Every evening | ORAL | Status: DC
  Administered 2018-07-16: 40 mg via ORAL
  Filled 2018-07-16: qty 2

## 2018-07-16 MED ORDER — EPHEDRINE 5 MG/ML INJ
INTRAVENOUS | Status: AC
  Filled 2018-07-16: qty 10

## 2018-07-16 MED ORDER — PHENYLEPHRINE HCL 10 MG/ML IJ SOLN
INTRAMUSCULAR | Status: AC
  Filled 2018-07-16: qty 1

## 2018-07-16 MED ORDER — CONJ ESTROG-MEDROXYPROGEST ACE 0.3-1.5 MG PO TABS: 1.0000 | ORAL_TABLET | Freq: Every evening | ORAL | Status: DC

## 2018-07-16 MED ORDER — GLUCOSAMINE-CHONDROITIN-MSM 500-400-422-83 MG PO TABS: 1.0000 | ORAL_TABLET | Freq: Every day | ORAL | Status: DC

## 2018-07-16 MED ORDER — PHENOL 1.4 % MT LIQD: 1.0000 | OROMUCOSAL | Status: DC | PRN

## 2018-07-16 MED ORDER — PHENYLEPHRINE 40 MCG/ML (10ML) SYRINGE FOR IV PUSH (FOR BLOOD PRESSURE SUPPORT)
PREFILLED_SYRINGE | INTRAVENOUS | Status: DC | PRN
  Administered 2018-07-16: 80 ug via INTRAVENOUS
  Administered 2018-07-16: 40 ug via INTRAVENOUS
  Administered 2018-07-16: 80 ug via INTRAVENOUS

## 2018-07-16 MED ORDER — CEFAZOLIN SODIUM-DEXTROSE 2-4 GM/100ML-% IV SOLN
2.0000 g | INTRAVENOUS | Status: AC
  Administered 2018-07-16: 2 g via INTRAVENOUS
  Filled 2018-07-16: qty 100

## 2018-07-16 MED ORDER — PROPOFOL 10 MG/ML IV BOLUS
INTRAVENOUS | Status: DC | PRN
  Administered 2018-07-16: 120 mg via INTRAVENOUS

## 2018-07-16 MED ORDER — FENTANYL CITRATE (PF) 250 MCG/5ML IJ SOLN
INTRAMUSCULAR | Status: AC
  Filled 2018-07-16: qty 5

## 2018-07-16 MED ORDER — CHLORHEXIDINE GLUCONATE 4 % EX LIQD: 60.0000 mL | Freq: Once | CUTANEOUS | Status: DC

## 2018-07-16 MED ORDER — LIDOCAINE 2% (20 MG/ML) 5 ML SYRINGE
INTRAMUSCULAR | Status: AC
  Filled 2018-07-16: qty 5

## 2018-07-16 MED ORDER — ONDANSETRON HCL 4 MG/2ML IJ SOLN
INTRAMUSCULAR | Status: DC | PRN
  Administered 2018-07-16: 4 mg via INTRAVENOUS

## 2018-07-16 MED ORDER — MIDAZOLAM HCL 2 MG/2ML IJ SOLN
INTRAMUSCULAR | Status: AC
  Filled 2018-07-16: qty 2

## 2018-07-16 MED ORDER — SUGAMMADEX SODIUM 200 MG/2ML IV SOLN
INTRAVENOUS | Status: DC | PRN
  Administered 2018-07-16: 140 mg via INTRAVENOUS

## 2018-07-16 MED ORDER — LEVOTHYROXINE SODIUM 88 MCG PO TABS
88.0000 ug | ORAL_TABLET | Freq: Every day | ORAL | Status: DC
  Administered 2018-07-17: 88 ug via ORAL
  Filled 2018-07-16: qty 1

## 2018-07-16 MED ORDER — VITAMIN D 25 MCG (1000 UNIT) PO TABS
5000.0000 [IU] | ORAL_TABLET | Freq: Every day | ORAL | Status: DC
  Administered 2018-07-17: 5000 [IU] via ORAL
  Filled 2018-07-16: qty 5

## 2018-07-16 MED ORDER — MENTHOL 3 MG MT LOZG: 1.0000 | LOZENGE | OROMUCOSAL | Status: DC | PRN

## 2018-07-16 MED ORDER — ONDANSETRON HCL 4 MG/2ML IJ SOLN
4.0000 mg | Freq: Four times a day (QID) | INTRAMUSCULAR | Status: DC | PRN
  Filled 2018-07-16: qty 2

## 2018-07-16 MED ORDER — ONDANSETRON HCL 4 MG PO TABS
4.0000 mg | ORAL_TABLET | Freq: Four times a day (QID) | ORAL | Status: DC | PRN
  Administered 2018-07-17: 4 mg via ORAL
  Filled 2018-07-16: qty 1

## 2018-07-16 MED ORDER — LUTEIN-ZEAXANTHIN PO TABS: 1.0000 | ORAL_TABLET | Freq: Every day | ORAL | Status: DC

## 2018-07-16 MED ORDER — STERILE WATER FOR IRRIGATION IR SOLN
Status: DC | PRN
  Administered 2018-07-16: 1000 mL

## 2018-07-16 MED ORDER — LACTATED RINGERS IV SOLN
INTRAVENOUS | Status: DC
  Administered 2018-07-16 (×2): via INTRAVENOUS

## 2018-07-16 MED ORDER — SODIUM CHLORIDE 0.9 % IV SOLN
INTRAVENOUS | Status: DC | PRN
  Administered 2018-07-16: 40 ug/min via INTRAVENOUS

## 2018-07-16 MED ORDER — PSYLLIUM 95 % PO PACK
1.0000 | PACK | Freq: Every day | ORAL | Status: DC
  Filled 2018-07-16: qty 1

## 2018-07-16 MED ORDER — MORPHINE SULFATE (PF) 2 MG/ML IV SOLN: 0.5000 mg | INTRAVENOUS | Status: DC | PRN

## 2018-07-16 MED ORDER — LIDOCAINE HCL (CARDIAC) PF 100 MG/5ML IV SOSY
PREFILLED_SYRINGE | INTRAVENOUS | Status: DC | PRN
  Administered 2018-07-16: 20 mg via INTRAVENOUS

## 2018-07-16 MED ORDER — ROCURONIUM BROMIDE 100 MG/10ML IV SOLN
INTRAVENOUS | Status: DC | PRN
  Administered 2018-07-16: 60 mg via INTRAVENOUS

## 2018-07-16 SURGICAL SUPPLY — 96 items
BAG ZIPLOCK 12X15 (MISCELLANEOUS) ×3 IMPLANT
BIT DRILL 1.6MX128 (BIT) ×2 IMPLANT
BIT DRILL 1.6MX128MM (BIT) ×1
BIT DRILL 170X2.5X (BIT) ×1 IMPLANT
BIT DRILL 2.0X128 (BIT) IMPLANT
BIT DRILL 2.0X128MM (BIT)
BIT DRL 170X2.5X (BIT) ×1
BLADE 4.2CUDA (BLADE) IMPLANT
BLADE EXTENDED COATED 6.5IN (ELECTRODE) ×3 IMPLANT
BLADE SAG 18X100X1.27 (BLADE) ×3 IMPLANT
BLADE SURG SZ11 CARB STEEL (BLADE) ×3 IMPLANT
BNDG COHESIVE 4X5 TAN STRL (GAUZE/BANDAGES/DRESSINGS) ×3 IMPLANT
BOOTIES KNEE HIGH SLOAN (MISCELLANEOUS) ×6 IMPLANT
BUR OVAL 4.0 (BURR) IMPLANT
BURR OVAL 8 FLU 4.0MM X 13CM (MISCELLANEOUS) ×1
BURR OVAL 8 FLU 4.0X13 (MISCELLANEOUS) ×2 IMPLANT
CHLORAPREP W/TINT 26 (MISCELLANEOUS) ×6 IMPLANT
CLOSURE WOUND 1/2 X4 (GAUZE/BANDAGES/DRESSINGS) ×1
COVER MAYO STAND STRL (DRAPES) ×3 IMPLANT
COVER SURGICAL LIGHT HANDLE (MISCELLANEOUS) ×3 IMPLANT
COVER WAND RF STERILE (DRAPES) IMPLANT
CUTTER BONE 4.0MM X 13CM (MISCELLANEOUS) ×3 IMPLANT
DECANTER SPIKE VIAL GLASS SM (MISCELLANEOUS) ×3 IMPLANT
DRAPE IMP U-DRAPE 54X76 (DRAPES) ×6 IMPLANT
DRAPE INCISE IOBAN 66X45 STRL (DRAPES) ×9 IMPLANT
DRAPE ORTHO SPLIT 77X108 STRL (DRAPES) ×12
DRAPE SHOULDER BEACH CHAIR (DRAPES) ×3 IMPLANT
DRAPE SURG ORHT 6 SPLT 77X108 (DRAPES) ×6 IMPLANT
DRAPE U-SHAPE 47X51 STRL (DRAPES) ×3 IMPLANT
DRILL 2.5 (BIT) ×2
DRSG ADAPTIC 3X8 NADH LF (GAUZE/BANDAGES/DRESSINGS) ×3 IMPLANT
DRSG EMULSION OIL 3X3 NADH (GAUZE/BANDAGES/DRESSINGS) ×3 IMPLANT
DRSG PAD ABDOMINAL 8X10 ST (GAUZE/BANDAGES/DRESSINGS) ×3 IMPLANT
ECCENTRIC EPIPHYSI MODULAR SZ1 (Trauma) ×1 IMPLANT
ELECT NEEDLE TIP 2.8 STRL (NEEDLE) ×3 IMPLANT
ELECT REM PT RETURN 15FT ADLT (MISCELLANEOUS) ×3 IMPLANT
GAUZE SPONGE 4X4 12PLY STRL (GAUZE/BANDAGES/DRESSINGS) ×3 IMPLANT
GLENOSPHERE XTEND RSA 38 EC +4 (Joint) ×3 IMPLANT
GLOVE BIOGEL PI ORTHO PRO 7.5 (GLOVE) ×2
GLOVE BIOGEL PI ORTHO PRO SZ8 (GLOVE) ×4
GLOVE ORTHO TXT STRL SZ7.5 (GLOVE) ×9 IMPLANT
GLOVE PI ORTHO PRO STRL 7.5 (GLOVE) ×1 IMPLANT
GLOVE PI ORTHO PRO STRL SZ8 (GLOVE) ×2 IMPLANT
GLOVE SURG ORTHO 8.5 STRL (GLOVE) ×9 IMPLANT
GOWN STRL REUS W/TWL LRG LVL3 (GOWN DISPOSABLE) ×6 IMPLANT
GOWN STRL REUS W/TWL XL LVL3 (GOWN DISPOSABLE) ×6 IMPLANT
KIT BASIN OR (CUSTOM PROCEDURE TRAY) ×6 IMPLANT
KIT TURNOVER KIT A (KITS) IMPLANT
MANIFOLD NEPTUNE II (INSTRUMENTS) ×6 IMPLANT
METAGLENE DELTA EXTEND (Trauma) ×1 IMPLANT
METAGLENE DXTEND (Trauma) ×3 IMPLANT
MODULAR ECCENTRIC EPIPHYSI SZ1 (Trauma) ×3 IMPLANT
NEEDLE HYPO 25X1 1.5 SAFETY (NEEDLE) IMPLANT
NEEDLE MAYO CATGUT SZ4 (NEEDLE) IMPLANT
NEEDLE SPNL 18GX3.5 QUINCKE PK (NEEDLE) ×3 IMPLANT
NS IRRIG 1000ML POUR BTL (IV SOLUTION) ×3 IMPLANT
PACK SHOULDER (CUSTOM PROCEDURE TRAY) ×3 IMPLANT
PIN GUIDE 1.2 (PIN) ×6 IMPLANT
PIN GUIDE GLENOPHERE 1.5MX300M (PIN) ×3 IMPLANT
PIN METAGLENE 2.5 (PIN) ×3 IMPLANT
PORT APPOLLO RF 90DEGREE MULTI (SURGICAL WAND) ×3 IMPLANT
PROTECTOR NERVE ULNAR (MISCELLANEOUS) ×3 IMPLANT
RESTRAINT HEAD UNIVERSAL NS (MISCELLANEOUS) ×3 IMPLANT
SCREW 4.5X18MM (Screw) ×2 IMPLANT
SCREW 4.5X36MM (Screw) ×3 IMPLANT
SCREW 48L (Screw) ×3 IMPLANT
SCREW BN 18X4.5XSTRL SHLDR (Screw) ×1 IMPLANT
SLING ARM FOAM STRAP LRG (SOFTGOODS) IMPLANT
SLING ARM FOAM STRAP MED (SOFTGOODS) ×3 IMPLANT
SLING ARM IMMOBILIZER LRG (SOFTGOODS) IMPLANT
SLING ARM IMMOBILIZER MED (SOFTGOODS) ×3 IMPLANT
SPACER 38 PLUS 3 (Spacer) ×3 IMPLANT
SPONGE LAP 4X18 RFD (DISPOSABLE) IMPLANT
STEM HUMERAL SZ8 STANDARD (Stem) ×3 IMPLANT
STEM HUMERAL SZ8 STD (Stem) ×1 IMPLANT
STOCKINETTE 6  STRL (DRAPES) ×2
STOCKINETTE 6 STRL (DRAPES) ×1 IMPLANT
STRIP CLOSURE SKIN 1/2X4 (GAUZE/BANDAGES/DRESSINGS) ×2 IMPLANT
SUCTION FRAZIER HANDLE 10FR (MISCELLANEOUS) ×2
SUCTION TUBE FRAZIER 10FR DISP (MISCELLANEOUS) ×1 IMPLANT
SUT ETHILON 4 0 PS 2 18 (SUTURE) ×3 IMPLANT
SUT FIBERWIRE #2 38 T-5 BLUE (SUTURE) ×12
SUT MNCRL AB 4-0 PS2 18 (SUTURE) ×3 IMPLANT
SUT VIC AB 0 CT1 36 (SUTURE) ×6 IMPLANT
SUT VIC AB 0 CT2 27 (SUTURE) ×3 IMPLANT
SUT VIC AB 2-0 CT1 27 (SUTURE) ×2
SUT VIC AB 2-0 CT1 TAPERPNT 27 (SUTURE) ×1 IMPLANT
SUTURE FIBERWR #2 38 T-5 BLUE (SUTURE) ×4 IMPLANT
SYR CONTROL 10ML LL (SYRINGE) IMPLANT
TAPE CLOTH SURG 6X10 WHT LF (GAUZE/BANDAGES/DRESSINGS) ×3 IMPLANT
TOWEL OR 17X26 10 PK STRL BLUE (TOWEL DISPOSABLE) ×6 IMPLANT
TOWER CARTRIDGE SMART MIX (DISPOSABLE) IMPLANT
TUBING ARTHRO INFLOW-ONLY STRL (TUBING) ×3 IMPLANT
TUBING CONNECTING 10 (TUBING) ×2 IMPLANT
TUBING CONNECTING 10' (TUBING) ×1
YANKAUER SUCT BULB TIP 10FT TU (MISCELLANEOUS) ×3 IMPLANT

## 2018-07-16 NOTE — Interval H&P Note (Signed)
History and Physical Interval Note:  07/16/2018 7:37 AM  Kimberly Johns  has presented today for surgery, with the diagnosis of Right shoulder rotator cuff tear.  The various methods of treatment have been discussed with the patient and family. After consideration of risks, benefits and other options for treatment, the patient has consented to  Procedure(s) with comments: SHOULDER ARTHROSCOPY WITH open ROTATOR CUFF REPAIR vs. Reverse Total Shoulder Arthroplasty (Right) - IS block REVERSE SHOULDER ARTHROPLASTY (Right) as a surgical intervention.  The patient's history has been reviewed, patient examined, no change in status, stable for surgery.  I have reviewed the patient's chart and labs.  Questions were answered to the patient's satisfaction.     Verlee Rossetti

## 2018-07-16 NOTE — Anesthesia Procedure Notes (Signed)
Procedure Name: Intubation Date/Time: 07/16/2018 7:49 AM Performed by: Raenette Rover, CRNA Pre-anesthesia Checklist: Patient identified, Emergency Drugs available, Suction available and Patient being monitored Patient Re-evaluated:Patient Re-evaluated prior to induction Oxygen Delivery Method: Circle system utilized Preoxygenation: Pre-oxygenation with 100% oxygen Induction Type: IV induction Ventilation: Mask ventilation without difficulty Laryngoscope Size: Mac and 3 Grade View: Grade I Tube type: Oral Tube size: 7.0 mm Number of attempts: 1 Airway Equipment and Method: Stylet Placement Confirmation: ETT inserted through vocal cords under direct vision,  positive ETCO2,  breath sounds checked- equal and bilateral and CO2 detector Secured at: 22 cm Tube secured with: Tape Dental Injury: Teeth and Oropharynx as per pre-operative assessment

## 2018-07-16 NOTE — Anesthesia Postprocedure Evaluation (Signed)
Anesthesia Post Note  Patient: Kimberly Johns  Procedure(s) Performed: SHOULDER ARTHROSCOPY (Right Shoulder) REVERSE SHOULDER ARTHROPLASTY (Right Shoulder)     Patient location during evaluation: PACU Anesthesia Type: General Level of consciousness: awake and alert Pain management: pain level controlled Vital Signs Assessment: post-procedure vital signs reviewed and stable Respiratory status: spontaneous breathing, nonlabored ventilation, respiratory function stable and patient connected to nasal cannula oxygen Cardiovascular status: blood pressure returned to baseline and stable Postop Assessment: no apparent nausea or vomiting Anesthetic complications: no    Last Vitals:  Vitals:   07/16/18 1115 07/16/18 1130  BP: (!) 107/59 (!) 103/49  Pulse: (!) 57 (!) 55  Resp: 20 19  Temp:  36.5 C  SpO2: 95% 95%    Last Pain:  Vitals:   07/16/18 1130  TempSrc:   PainSc: 0-No pain                 Jeane Cashatt

## 2018-07-16 NOTE — Transfer of Care (Signed)
Immediate Anesthesia Transfer of Care Note  Patient: Kimberly Johns  Procedure(s) Performed: SHOULDER ARTHROSCOPY (Right Shoulder) REVERSE SHOULDER ARTHROPLASTY (Right Shoulder)  Patient Location: PACU  Anesthesia Type:GA combined with regional for post-op pain  Level of Consciousness: awake, alert , oriented and patient cooperative  Airway & Oxygen Therapy: Patient Spontanous Breathing and Patient connected to nasal cannula oxygen  Post-op Assessment: Report given to RN and Post -op Vital signs reviewed and stable  Post vital signs: Reviewed and stable  Last Vitals:  Vitals Value Taken Time  BP 116/59 07/16/2018 10:32 AM  Temp    Pulse 62 07/16/2018 10:34 AM  Resp 14 07/16/2018 10:34 AM  SpO2 100 % 07/16/2018 10:34 AM  Vitals shown include unvalidated device data.  Last Pain:  Vitals:   07/16/18 0551  TempSrc:   PainSc: 0-No pain      Patients Stated Pain Goal: 4 (07/16/18 0551)  Complications: No apparent anesthesia complications

## 2018-07-16 NOTE — Anesthesia Procedure Notes (Signed)
Anesthesia Regional Block: Interscalene brachial plexus block   Pre-Anesthetic Checklist: ,, timeout performed, Correct Patient, Correct Site, Correct Laterality, Correct Procedure, Correct Position, site marked, Risks and benefits discussed,  Surgical consent,  Pre-op evaluation,  At surgeon's request and post-op pain management  Laterality: Right  Prep: chloraprep       Needles:  Injection technique: Single-shot  Needle Type: Echogenic Stimulator Needle     Needle Length: 5cm  Needle Gauge: 22     Additional Needles:   Procedures:, nerve stimulator,,, ultrasound used (permanent image in chart),,,,   Nerve Stimulator or Paresthesia:  Response: delt, 0.45 mA,   Additional Responses:   Narrative:  Start time: 07/16/2018 6:50 AM End time: 07/16/2018 6:55 AM Injection made incrementally with aspirations every 5 mL.  Performed by: Personally  Anesthesiologist: Bethena Midget, MD  Additional Notes: Functioning IV was confirmed and monitors were applied.  A 66mm 22ga Arrow echogenic stimulator needle was used. Sterile prep and drape,hand hygiene and sterile gloves were used. Ultrasound guidance: relevant anatomy identified, needle position confirmed, local anesthetic spread visualized around nerve(s)., vascular puncture avoided.  Image printed for medical record. Negative aspiration and negative test dose prior to incremental administration of local anesthetic. The patient tolerated the procedure well.

## 2018-07-16 NOTE — Discharge Instructions (Signed)
Ice to the shoulder constantly.  Keep the incision covered and clean and dry for one week, then ok to get it wet in the shower.  Do exercise as instructed several times per day. Ok to use the right arm for gentle self care.   DO NOT reach behind your back or push up out of a chair with the operative arm.  Use a sling while you are up and around for comfort, may remove while seated.  Keep pillow propped behind the operative elbow.  Follow up with Dr Ranell Patrick in two weeks in the office, call [phone] for appt

## 2018-07-16 NOTE — Brief Op Note (Signed)
07/16/2018  10:32 AM  PATIENT:  Kimberly Johns  78 y.o. female  PRE-OPERATIVE DIAGNOSIS:  Right shoulder rotator cuff tear, shoulder dysfunction  POST-OPERATIVE DIAGNOSIS:  Right shoulder rotator cuff tear irrepairable, post-traumatic arthritis  PROCEDURE:  Procedure(s) with comments: SHOULDER ARTHROSCOPY (Right) - IS block REVERSE SHOULDER ARTHROPLASTY (Right) DePuy Delta Xtend  SURGEON:  Surgeon(s) and Role:    Beverely Low, MD - Primary  PHYSICIAN ASSISTANT:   ASSISTANTS: Thea Gist, PA-C   ANESTHESIA:   regional and general  EBL:  25 mL   BLOOD ADMINISTERED:none  DRAINS: none   LOCAL MEDICATIONS USED:  MARCAINE     SPECIMEN:  No Specimen  DISPOSITION OF SPECIMEN:  N/A  COUNTS:  YES  TOURNIQUET:  * No tourniquets in log *  DICTATION: .Other Dictation: Dictation Number [id number]  PLAN OF CARE: Admit to inpatient   PATIENT DISPOSITION:  PACU - hemodynamically stable.   Delay start of Pharmacological VTE agent (>24hrs) due to surgical blood loss or risk of bleeding: no

## 2018-07-16 NOTE — Op Note (Signed)
NAME: Kimberly Johns, Kimberly Johns MEDICAL RECORD LZ:[id number] ACCOUNT [id number] DATE OF BIRTH:[birth year] FACILITY: [facility] LOCATION: [facility] PHYSICIAN:STEVEN Orlena Sheldon, MD  OPERATIVE REPORT  DATE OF PROCEDURE:  07/16/2018  PREOPERATIVE DIAGNOSES:  Right shoulder history of dislocation with multi-tendon rotator cuff tear with retraction and profound shoulder dysfunction.  POSTOPERATIVE DIAGNOSES:  Status post right shoulder dislocation with posttraumatic arthritis and cartilage damage as well as irreparable rotator cuff tear, multi-tendon.  PROCEDURE PERFORMED:  Right shoulder arthroscopy with limited debridement followed by open reverse shoulder replacement using DePuy Delta Xtend prosthesis.  ATTENDING SURGEON:  Esmond Plants, MD  ASSISTANT:  Darol Destine, [place], who was scrubbed during the entire procedure and necessary for satisfactory completion of surgery.  ANESTHESIA:  General anesthesia was used plus interscalene block.  ESTIMATED BLOOD LOSS:  200 mL.  FLUID REPLACEMENT:  1500 mL crystalloid.  INSTRUMENT COUNTS:  Correct.  COMPLICATIONS:  No complications.  ANTIBIOTICS:  Perioperative antibiotics given.  INDICATIONS:  The patient is a 78 year old female who is very active, who suffered a dislocation of her shoulder requiring reduction in the emergency department.  The patient had profound shoulder dysfunction and ongoing pain following her relocation.   Post-reduction MRI scan indicating multi-tendon rotator cuff tear with retraction.  Given the patient's ongoing pain and poor function, we elected to proceed with shoulder surgery.  The initial plan was for a shoulder arthroscopy and inspection to see if  the humeral head was in a condition to be able to move forward with repair and then also to see if her rotator cuff tendon was mobile and was repairable.  If that proved not to be the case, the plan was to move forward with reverse shoulder placement to  restore fixed focal mechanics  and restore range of motion and function to her shoulder.  Informed consent was obtained.  DESCRIPTION OF PROCEDURE:  After an adequate level of anesthesia was achieved, the patient was positioned in modified beach-chair position.  Right shoulder correctly identified and sterilely prepped and draped in the usual manner.  Timeout called.  We  entered the shoulder using standard arthroscopic portals including anterior, posterior and lateral portals.  We found a torn biceps tendon that was scarred to the anterior superior glenohumeral ligament that was released.  The labrum was torn.  We  cleaned that up with a suction shaver.  The rotator cuff was torn and retracted involving supraspinatus and infraspinatus retracted to the level of the glenoid.  A cuff grasper mobilized the tendon about halfway to the greater tuberosity.  The humeral  head  posteriorly had a large Hill-Sachs lesion and also delamination of the cartilage over a decent portion of the head, perhaps 1/3 of it.  This appeared not to be a situation where we wanted to move forward with repair due to concern over the  delaminated tendon which was retracted and also the poor quality of the humeral head.  Decision was made to move forward with reverse shoulder placement.  We went ahead and sutured the wounds.  We then did a separate prep and drape for the shoulder  replacement.  Once we were sterilely prepped and draped again for the shoulder placement and prepared with our back table and our equipment, we went ahead and re-verified the correct patient and correct site and performed a deltopectoral incision,  started the coracoid process, extending down to the anterior humerus.  Dissection down through subcutaneous tissues using Bovie, identified the cephalic vein, took it laterally with the deltoid pectoralis  taken medially.  Conjoined tendon identified and  retracted medially.  We tenodesed the biceps in situ with figure-of-eight suture of 0 Vicryl.   We then released the subscapularis and tagged for repair with #2 FiberWire suture in a modified Mason-Allen suture technique.  Once we had that tagged for  repair, we released the capsule inferiorly, progressively externally rotated.  We then released the remaining anterior portion of the supraspinatus that was just a little tiny piece of that anterior table.  We released that.  We did an additional  inspection of the supraspinatus, infraspinatus and were able to get it about halfway to the greater tuberosity, but then we met a firm stop point.  Again, the tendon was in very poor condition.  The humeral head looked terrible with getting a lot of  cartilage loss on it.  We extended the shoulder and delivered the humeral head out of the wound.  The teres minor was still intact.  We did place a 6 mm reamer in the humerus and reamed up to a size 8.  We tried the 10 but could not get the tendon to go.   It was just too tight distally.  We went back to the 8 for the intramedullary resection guide for the head, and we resected the head at 10 degrees of retroversion.  We removed excess cartilage off the inferior portion of that head below the cut.  Then  we subluxed the humerus back posteriorly, and we were able to get 360-degree exposure of the glenoid face.  We removed the glenoid labrum and the hypertrophied capsule.  Next, we identified the center point for our guide pin.  We removed the cartilage  with a Cobb elevator first and then found the center point of the glenoid with a guide pin, angling slightly inferiorly.  We then reamed for the metaglene baseplate and then drilled our central peg hole.  We did our peripheral hand reaming as well before  we drilled the peg hole.  Once that central peg or post was drilled, we impacted the metaglene into place.  We had good bony support, and we placed a 48 screw inferiorly, a 36 superiorly, and then an 18 nonlocked posteriorly.  It had locking screws  superiorly and  inferiorly and excellent baseplate security.  We used a 38+4 eccentric, so slightly lateralized glenosphere.  We next went ahead and went back to the humeral side.  We prepared the proximal humerus with a size 1 right and reamed for that  metaphyseal component.  We then connected the 8 stem to the size 1 right metaphysis on the 0 setting and placed in 10 degrees of retroversion and impacted in position.  We reduced with a 38+3 poly and were happy with that soft tissue balancing with a  good tight conjoined.  No gapping with inferior pole or external rotation.  We removed the trial components, irrigated thoroughly, drilled holes in the lesser tuberosity.  We placed #2 FiberWire suture for repair of the subscapularis.  We then used  impaction grafting technique with available bone graft from the head and used a [place]oat distal 8 stem with the HA-coated size 1 right metaphysis set on the 0 setting and impacted into 10 degrees of retroversion.  We were pleased with that security of  the stem.  We selected a 38+3 poly, impacted that in position and reduced the shoulder, had a nice little pop as it reduced, and was secured throughout a full arc of motion,  no impingement.  We repaired the subscapularis after thorough irrigation  anatomically to bone.  We had a nice solid repair.  We ranged the shoulder.  No impingement, and the subscap repair did not limit motion.  We then irrigated again and repaired the deltopectoral interval with 0 Vicryl suture followed by 2-0 Vicryl for  subcutaneous closure and 4-0 Monocryl for skin.  Steri-Strips were applied followed by a sterile dressing.  The patient tolerated surgery well.  LN/NUANCE  D:07/16/2018 T:07/16/2018 JOB:[id number]

## 2018-07-17 LAB — HEMOGLOBIN AND HEMATOCRIT, BLOOD
HCT: 30.2 % — ABNORMAL LOW (ref 36.0–46.0)
HEMOGLOBIN: 9.4 g/dL — AB (ref 12.0–15.0)

## 2018-07-17 LAB — BASIC METABOLIC PANEL
Anion gap: 12 (ref 5–15)
BUN: 24 mg/dL — ABNORMAL HIGH (ref 8–23)
CO2: 20 mmol/L — ABNORMAL LOW (ref 22–32)
Calcium: 8.3 mg/dL — ABNORMAL LOW (ref 8.9–10.3)
Chloride: 108 mmol/L (ref 98–111)
Creatinine, Ser: 0.9 mg/dL (ref 0.44–1.00)
GFR calc Af Amer: >60 mL/min (ref >60)
GFR calc non Af Amer: >60 mL/min (ref >60)
Glucose, Bld: 124 mg/dL — ABNORMAL HIGH (ref 70–99)
Potassium: 4.1 mmol/L (ref 3.5–5.1)
Sodium: 140 mmol/L (ref 135–145)

## 2018-07-17 NOTE — Progress Notes (Signed)
IQRA SUHR  MRN: [id number] DOB/Age: Jan 18, 1941 78 y.o. Physician: Lynnea Maizes, M.D. 1 Day Post-Op Procedure(s) (LRB): SHOULDER ARTHROSCOPY (Right) REVERSE SHOULDER ARTHROPLASTY (Right)  Subjective: Patient resting comfortably sitting in bedside chair.  Reports effective nerve block wore off at 3 AM.  Pain now controlled with oral hydrocodone. Vital Signs Temp:  [97.5 F (36.4 C)-97.8 F (36.6 C)] 97.6 F (36.4 C) (03/14 0616) Pulse Rate:  [55-70] 62 (03/14 0616) Resp:  [15-20] 16 (03/14 0616) BP: (100-113)/(48-67) 111/67 (03/14 0616) SpO2:  [94 %-98 %] 96 % (03/14 0616)  Lab Results Recent Labs    07/17/18 0446  HGB 9.4*  HCT 30.2*   BMET Recent Labs    07/17/18 0446  NA 140  K 4.1  CL 108  CO2 20*  GLUCOSE 124*  BUN 24*  CREATININE 0.90  CALCIUM 8.3*   INR  Date Value Ref Range Status  12/01/2014 0.97 0.00 - 1.49 Final     Exam  Right shoulder dressings are dry.  Excellent digital motion in the right hand.  Grossly neurovascular intact.  Radiographs demonstrate implant to be in good position and alignment.  Plan Discharge to home following standard reverse shoulder arthroplasty rehab protocol.  Follow-up with Dr. Ranell Patrick as previously discussed [facility] M Maysel Mccolm 07/17/2018, 10:37 AM    Contact # [phone]

## 2018-07-17 NOTE — Evaluation (Signed)
Occupational Therapy Evaluation Patient Details Name: Kimberly Johns MRN: [id number] DOB: 06/07/1940 Today's Date: 07/17/2018    History of Present Illness s/p R TSA.  H/o dislocation and rotator cuff tendon tears   Clinical Impression   This 78year old female was admitted for the above sx. All education was completed. No further OT is needed at this time    Follow Up Recommendations  Follow surgeon's recommendation for DC plan and follow-up therapies    Equipment Recommendations  None recommended by OT    Recommendations for Other Services       Precautions / Restrictions Precautions Precautions: Shoulder Restrictions Weight Bearing Restrictions: Yes RUE Weight Bearing: Non weight bearing      Mobility Bed Mobility Overal bed mobility: Independent                Transfers Overall transfer level: Independent                    Balance                                           ADL either performed or assessed with clinical judgement   ADL Overall ADL's : Needs assistance/impaired Eating/Feeding: Independent   Grooming: Independent   Upper Body Bathing: Minimal assistance   Lower Body Bathing: Minimal assistance   Upper Body Dressing : Moderate assistance   Lower Body Dressing: Minimal assistance   Toilet Transfer: Independent   Toileting- Clothing Manipulation and Hygiene: Independent         General ADL Comments: pt moving around room on her own and steady.  Reviewed protocol and assisted with UB adls.  Pt verbalizes all. She has worn an immobilizer in the past and boyfriend assisted with it     Vision         Perception     Praxis      Pertinent Vitals/Pain Pain Assessment: No/denies pain     Hand Dominance Left   Extremity/Trunk Assessment Upper Extremity Assessment Upper Extremity Assessment: (RUE immobilized)           Communication Communication Communication: No difficulties   Cognition  Arousal/Alertness: Awake/alert Behavior During Therapy: WFL for tasks assessed/performed Overall Cognitive Status: Within Functional Limits for tasks assessed                                     General Comments       Exercises     Shoulder Instructions      Home Living Family/patient expects to be discharged to:: Private residence Living Arrangements: Spouse/significant other                 Bathroom Shower/Tub: Tub/shower unit(grab bar)   Bathroom Toilet: Standard         Additional Comments: boyfriend will help as needed. He helped with previous injury      Prior Functioning/Environment Level of Independence: Independent                 OT Problem List:        OT Treatment/Interventions:      OT Goals(Current goals can be found in the care plan section) Acute Rehab OT Goals Patient Stated Goal: good outcome. Return to skiiing OT Goal Formulation: All assessment and education complete, DC therapy  OT Frequency:     Barriers to D/C:            Co-evaluation              AM-PAC OT "6 Clicks" Daily Activity     Outcome Measure Help from another person eating meals?: None Help from another person taking care of personal grooming?: None Help from another person toileting, which includes using toliet, bedpan, or urinal?: None Help from another person bathing (including washing, rinsing, drying)?: A Little Help from another person to put on and taking off regular upper body clothing?: A Lot Help from another person to put on and taking off regular lower body clothing?: A Little 6 Click Score: 20   End of Session    Activity Tolerance: Patient tolerated treatment well Patient left: (up in room)  OT Visit Diagnosis: Pain Pain - Right/Left: Right Pain - part of body: Shoulder                Time: 2761-4709 OT Time Calculation (min): 22 min Charges:  OT General Charges $OT Visit: 1 Visit OT Evaluation $OT Eval Low  Complexity: 1 Low  Marica Otter, OTR/L Acute Rehabilitation Services [phone] [facility] pager [phone] office 07/17/2018  Randye Treichler 07/17/2018, 10:51 AM

## 2018-07-20 ENCOUNTER — Encounter (HOSPITAL_COMMUNITY): Payer: Self-pay | Admitting: Orthopedic Surgery

## 2018-07-20 NOTE — Discharge Summary (Signed)
Orthopedic Discharge Summary        Physician Discharge Summary  Patient ID: Kimberly Johns MRN: [id number] DOB/AGE: 01/01/1941 78 y.o.  Admit date: 07/16/2018 Discharge date: 07/17/18   Procedures:  Procedure(s) (LRB): SHOULDER ARTHROSCOPY (Right) REVERSE SHOULDER ARTHROPLASTY (Right)  Attending Physician:  Dr. Malon Kindle  Admission Diagnoses:   Right shoulder cuff arthropathy Discharge Diagnoses:  Right shoulder cuff arthropathy    Past Medical History:  Diagnosis Date  . Anxiety    managed with propanolol   . Arthritis   . Colon polyps   . Diverticulosis   . Fracture of ankle, trimalleolar, left, closed   . Gallbladder disease    denies   . GERD (gastroesophageal reflux disease)   . History of chicken pox   . HLD (hyperlipidemia)   . Hypothyroidism   . Kidney cysts   . Liver cyst   . Measles    hx of   . Mumps    hx of   . PONV (postoperative nausea and vomiting)    with general anesthesia   . Rotator cuff tear    right   . Shingles    hx of     PCP: Shirlean Mylar, MD   Discharged Condition: good  Hospital Course:  Patient underwent the above stated procedure on 07/16/2018. Patient tolerated the procedure well and brought to the recovery room in good condition and subsequently to the floor. Patient had an uncomplicated hospital course and was stable for discharge.   Disposition:  with follow up in 2 weeks   Follow-up Information    Beverely Low, MD. Call in 2 weeks.   Specialty:  Orthopedic Surgery Why:  [phone] Contact information: [address] [place] [address] [phone]             Allergies as of 07/17/2018      Reactions   Vancomycin Rash      Medication List    STOP taking these medications   doxycycline 100 MG tablet Commonly known as:  VIBRA-TABS     TAKE these medications   acetaminophen 500 MG tablet Commonly known as:  TYLENOL Take 500 mg by mouth every 6 (six) hours as needed for  moderate pain.   Calcium Carbonate-Vitamin D 600-400 MG-UNIT tablet Take 1 tablet by mouth daily with supper.   celecoxib 200 MG capsule Commonly known as:  CELEBREX TAKE 1 CAPSULE BY MOUTH TWICE DAILY What changed:  when to take this   CULTURELLE PO Take 1 capsule by mouth every evening.   estrogen (conjugated)-medroxyprogesterone 0.3-1.5 MG tablet Commonly known as:  PREMPRO Take 1 tablet by mouth every evening.   Glucosamine-Chondroitin-MSM [phone] MG Tabs Take 1 tablet by mouth at bedtime.   HYDROcodone-acetaminophen 5-325 MG tablet Commonly known as:  Norco Take 1-2 tablets by mouth every 6 (six) hours as needed for moderate pain.   levothyroxine 88 MCG tablet Commonly known as:  SYNTHROID, LEVOTHROID Take 88 mcg by mouth daily before breakfast.   Metamucil 30.9 % Powd Generic drug:  Psyllium Take 1 packet by mouth at bedtime.   methocarbamol 500 MG tablet Commonly known as:  ROBAXIN Take 500 mg by mouth every 8 (eight) hours as needed for muscle spasms. What changed:  Another medication with the same name was added. Make sure you understand how and when to take each.   methocarbamol 500 MG tablet Commonly known as:  Robaxin Take 1 tablet (500 mg total) by mouth 3 (three) times daily  as needed for muscle spasms. What changed:  You were already taking a medication with the same name, and this prescription was added. Make sure you understand how and when to take each.   omeprazole 40 MG capsule Commonly known as:  PRILOSEC Take 40 mg by mouth every morning.   pravastatin 40 MG tablet Commonly known as:  PRAVACHOL Take 40 mg by mouth every evening.   propranolol 10 MG tablet Commonly known as:  INDERAL Take 10 mg by mouth daily as needed for anxiety.   trimethoprim 100 MG tablet Commonly known as:  TRIMPEX Take 100 mg by mouth every evening.   Vitamin D 125 MCG (5000 UT) Caps Take 5,000 Units by mouth daily.   Lutein-Zeaxanthin Tabs Take 1 tablet  by mouth daily with supper.   Womens Multivitamin Plus Tabs Take 1 tablet by mouth daily with supper.         Signed: Thea Gist 07/20/2018, 8:12 AM  [facility] is now [employer] [address], [place], [place] [address] Phone: [phone] Facebook  Instagram  [employer]

## 2018-07-21 MED FILL — CELECOXIB 200 MG CAP: 200 | 30 days supply | Qty: 60 | Fill #2

## 2018-07-21 MED FILL — LEVOTHYROXINE 88 MCG TABLET: 88 | 78 days supply | Qty: 90 | Fill #3

## 2018-07-21 MED FILL — PRAVASTATIN NA 40 MG TAB: 40 | 30 days supply | Qty: 30 | Fill #6 | Status: TO

## 2018-07-27 DIAGNOSIS — Z471 Aftercare following joint replacement surgery: Secondary | ICD-10-CM | POA: Diagnosis not present

## 2018-07-27 DIAGNOSIS — Z4789 Encounter for other orthopedic aftercare: Secondary | ICD-10-CM | POA: Diagnosis not present

## 2018-07-29 MED FILL — OXYCODONE-ACETAMINOPHEN 5-3: 5-325 | 5 days supply | Qty: 25 | Fill #0

## 2018-08-07 DIAGNOSIS — W540XXA Bitten by dog, initial encounter: Secondary | ICD-10-CM | POA: Diagnosis not present

## 2018-08-07 DIAGNOSIS — L03114 Cellulitis of left upper limb: Secondary | ICD-10-CM | POA: Diagnosis not present

## 2018-08-24 DIAGNOSIS — Z471 Aftercare following joint replacement surgery: Secondary | ICD-10-CM | POA: Diagnosis not present

## 2018-08-24 DIAGNOSIS — Z4789 Encounter for other orthopedic aftercare: Secondary | ICD-10-CM | POA: Diagnosis not present

## 2018-09-29 ENCOUNTER — Telehealth: Payer: Self-pay

## 2018-09-29 ENCOUNTER — Other Ambulatory Visit: Payer: Self-pay | Admitting: Orthopaedic Surgery

## 2018-09-29 MED ORDER — CELECOXIB 200 MG PO CAPS: 200.0000 mg | ORAL_CAPSULE | Freq: Three times a day (TID) | ORAL | 2 refills | Status: DC | PRN

## 2018-09-29 NOTE — Telephone Encounter (Signed)
Patient requests refill of Celebrex 200mg  to her [facility]

## 2018-09-30 ENCOUNTER — Telehealth: Payer: Self-pay

## 2018-09-30 NOTE — Telephone Encounter (Signed)
Patient would like a Rx refill for Celebrex [facility] to [facility] [address]  CB# is [phone].  Please advise.  Thank You.

## 2018-09-30 NOTE — Telephone Encounter (Signed)
Called into pharmacy

## 2018-10-05 DIAGNOSIS — Z4789 Encounter for other orthopedic aftercare: Secondary | ICD-10-CM | POA: Diagnosis not present

## 2018-10-05 DIAGNOSIS — Z471 Aftercare following joint replacement surgery: Secondary | ICD-10-CM | POA: Diagnosis not present

## 2018-11-30 DIAGNOSIS — Z0000 Encounter for general adult medical examination without abnormal findings: Secondary | ICD-10-CM | POA: Diagnosis not present

## 2018-11-30 DIAGNOSIS — E785 Hyperlipidemia, unspecified: Secondary | ICD-10-CM | POA: Diagnosis not present

## 2018-11-30 DIAGNOSIS — H35033 Hypertensive retinopathy, bilateral: Secondary | ICD-10-CM | POA: Diagnosis not present

## 2018-11-30 DIAGNOSIS — E039 Hypothyroidism, unspecified: Secondary | ICD-10-CM | POA: Diagnosis not present

## 2018-11-30 DIAGNOSIS — I700 Atherosclerosis of aorta: Secondary | ICD-10-CM | POA: Diagnosis not present

## 2018-12-14 DIAGNOSIS — I10 Essential (primary) hypertension: Secondary | ICD-10-CM | POA: Diagnosis not present

## 2018-12-28 ENCOUNTER — Other Ambulatory Visit: Payer: Self-pay | Admitting: Orthopaedic Surgery

## 2018-12-28 NOTE — Telephone Encounter (Signed)
Please advise 

## 2019-02-17 DIAGNOSIS — Z1231 Encounter for screening mammogram for malignant neoplasm of breast: Secondary | ICD-10-CM | POA: Diagnosis not present

## 2019-04-04 DIAGNOSIS — L409 Psoriasis, unspecified: Secondary | ICD-10-CM | POA: Diagnosis not present

## 2019-04-04 MED FILL — DESOXIMETASONE 0.25% CREAM: 0.25 | 14 days supply | Qty: 60 | Fill #0

## 2019-04-12 DIAGNOSIS — Z96611 Presence of right artificial shoulder joint: Secondary | ICD-10-CM | POA: Diagnosis not present

## 2019-04-12 DIAGNOSIS — Z471 Aftercare following joint replacement surgery: Secondary | ICD-10-CM | POA: Diagnosis not present

## 2019-04-25 ENCOUNTER — Other Ambulatory Visit: Payer: Self-pay | Admitting: Orthopaedic Surgery

## 2019-04-25 ENCOUNTER — Telehealth: Payer: Self-pay

## 2019-04-25 MED ORDER — CELECOXIB 200 MG PO CAPS: 200.0000 mg | ORAL_CAPSULE | Freq: Two times a day (BID) | ORAL | 2 refills | Status: DC | PRN

## 2019-04-25 MED FILL — CELECOXIB 200 MG CAP: 200 | 30 days supply | Qty: 60 | Fill #0

## 2019-04-25 NOTE — Telephone Encounter (Signed)
Patient wants refill of Celebrex Last appointment with patient was 07/13/17

## 2019-04-27 ENCOUNTER — Other Ambulatory Visit: Payer: Self-pay

## 2019-04-27 MED ORDER — CELECOXIB 200 MG PO CAPS: 200.0000 mg | ORAL_CAPSULE | Freq: Two times a day (BID) | ORAL | 2 refills | Status: DC | PRN

## 2019-05-18 DIAGNOSIS — H35033 Hypertensive retinopathy, bilateral: Secondary | ICD-10-CM | POA: Diagnosis not present

## 2019-05-18 DIAGNOSIS — H35372 Puckering of macula, left eye: Secondary | ICD-10-CM | POA: Diagnosis not present

## 2019-05-18 DIAGNOSIS — H2513 Age-related nuclear cataract, bilateral: Secondary | ICD-10-CM | POA: Diagnosis not present

## 2019-05-18 DIAGNOSIS — H43811 Vitreous degeneration, right eye: Secondary | ICD-10-CM | POA: Diagnosis not present

## 2019-05-18 DIAGNOSIS — H25013 Cortical age-related cataract, bilateral: Secondary | ICD-10-CM | POA: Diagnosis not present

## 2019-06-14 DIAGNOSIS — N3946 Mixed incontinence: Secondary | ICD-10-CM | POA: Diagnosis not present

## 2019-08-27 ENCOUNTER — Other Ambulatory Visit: Payer: Self-pay | Admitting: Orthopaedic Surgery

## 2019-10-23 IMAGING — CR DG SHOULDER 2+V*R*
2 series · 2 of 2 positions shown · non-contrast
Comparison: None.

CLINICAL DATA: 78-year-old female status post fall in front yd
today with right shoulder pain and deformity.

EXAM:
RIGHT SHOULDER - 2+ VIEW

[w shoulder external right]
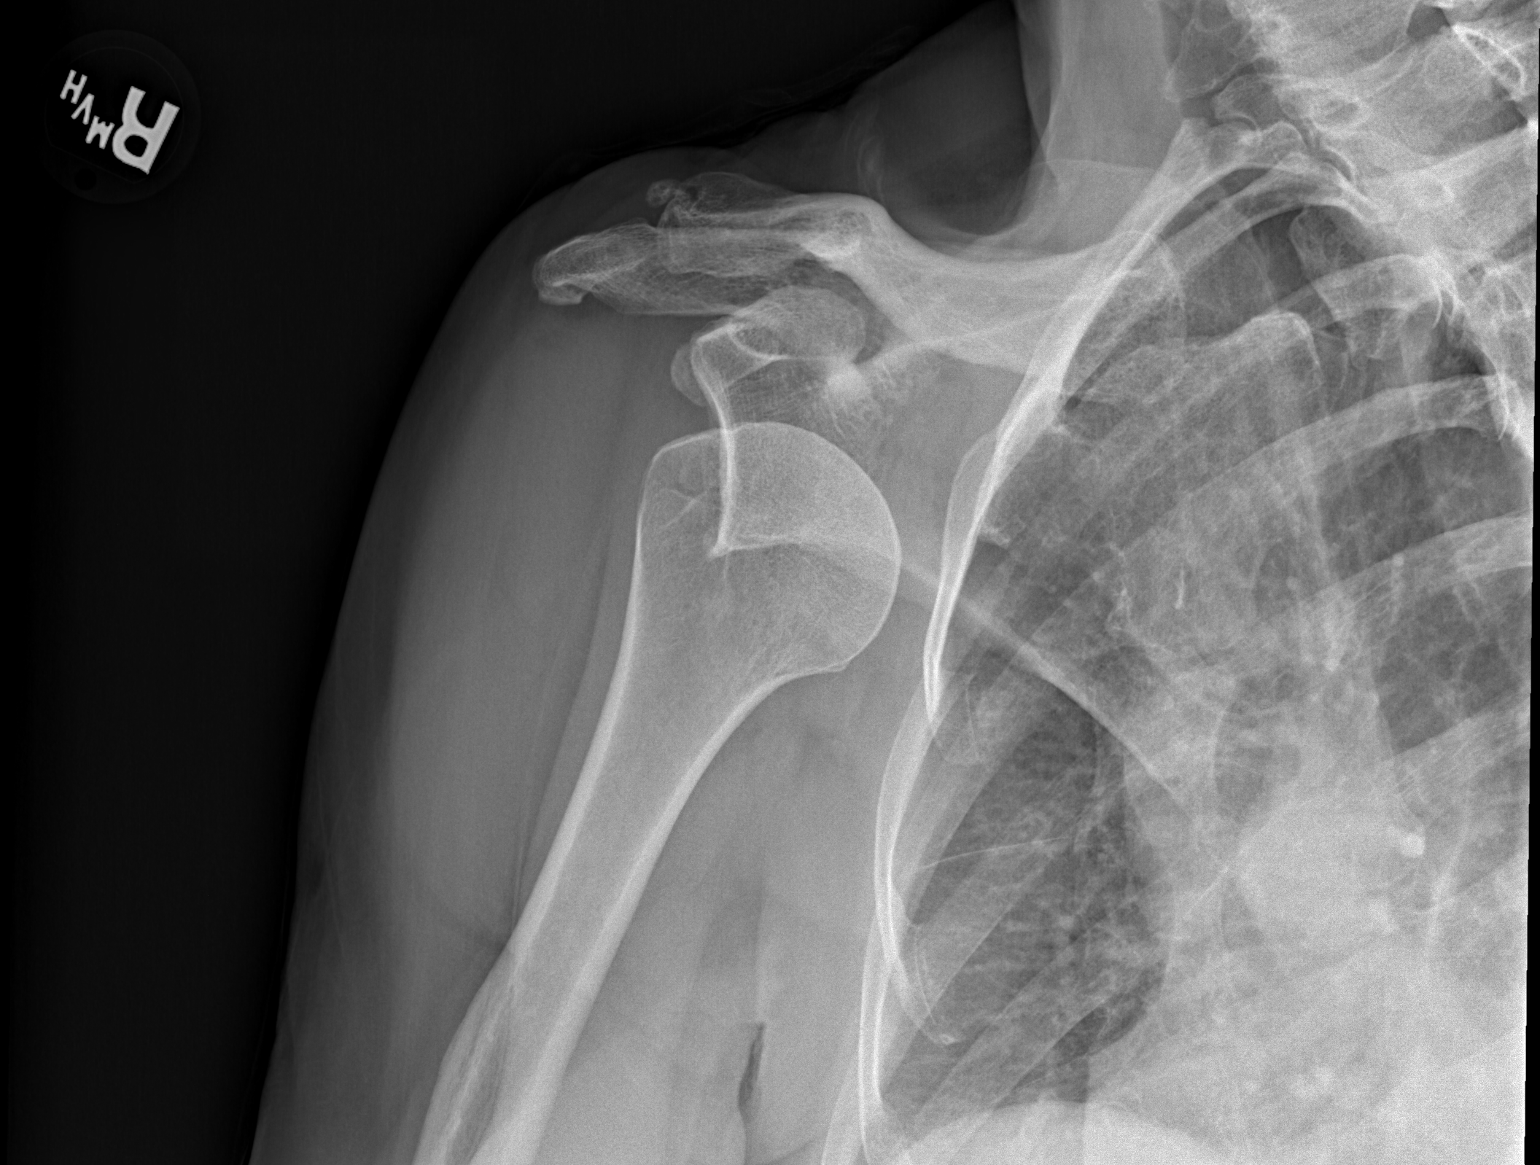

[w shoulder y-view right]
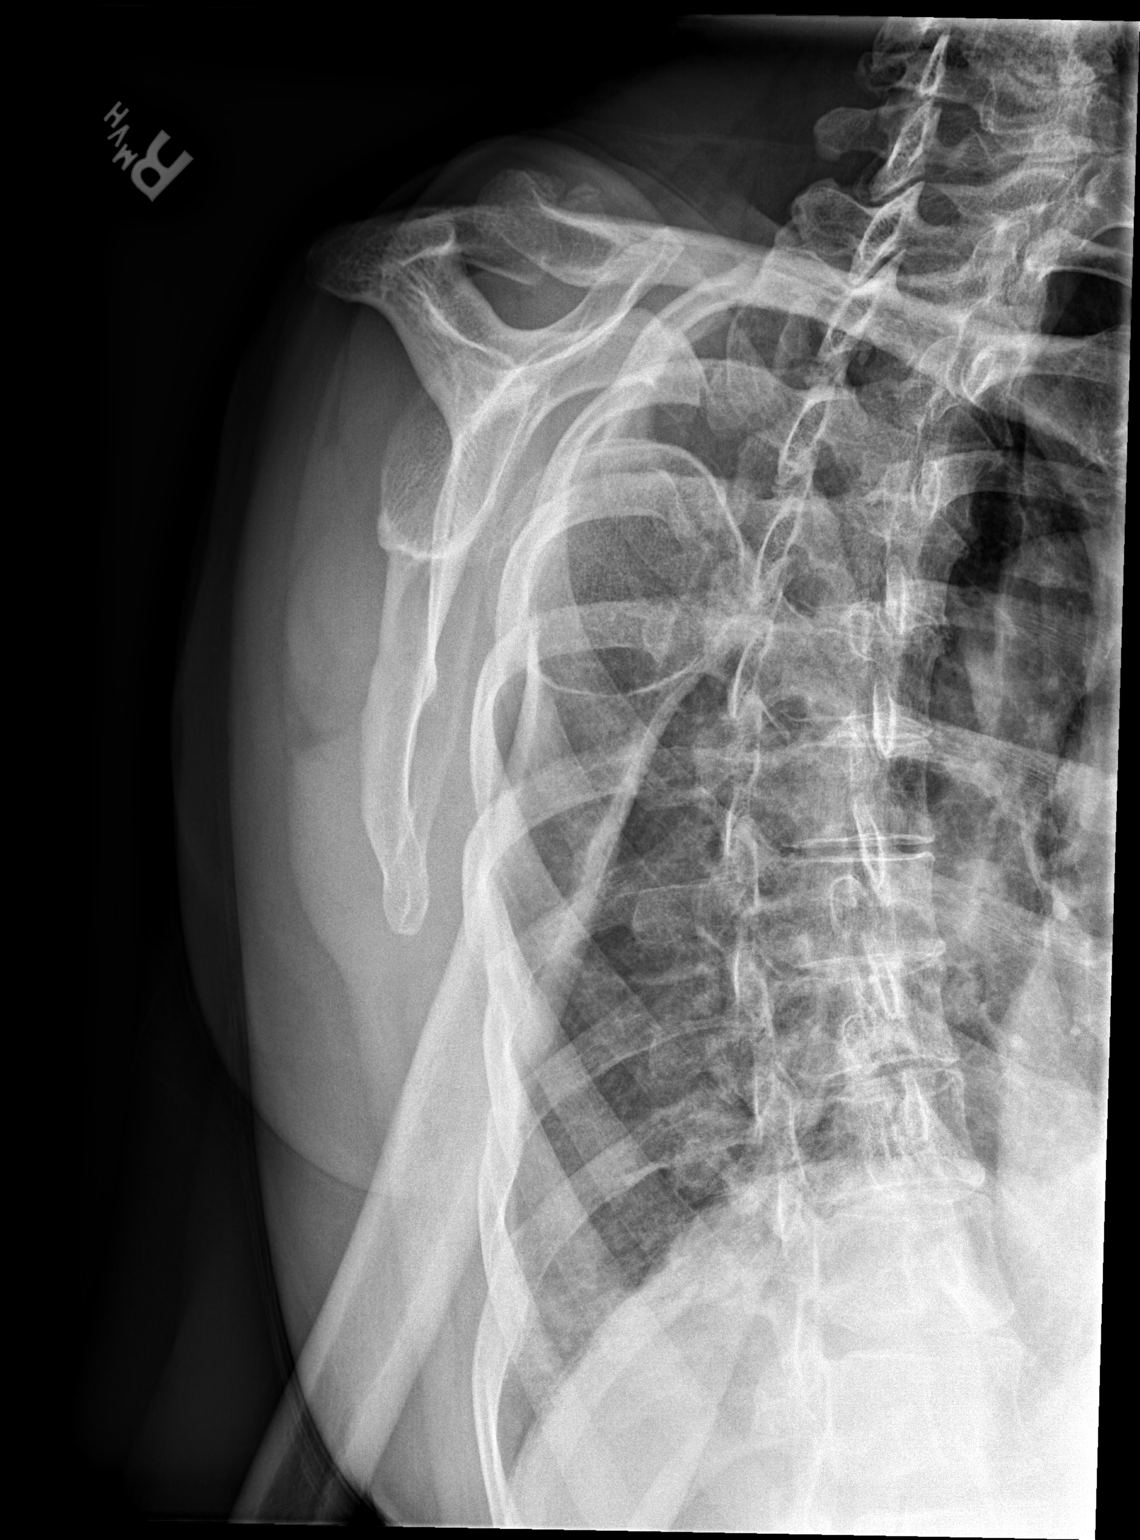

[2 of 2 positions shown; findings below may reference images not displayed]

FINDINGS: Anterior, subcoracoid right glenohumeral dislocation. No proximal
right humerus or scapula fracture identified. The right clavicle
appears intact with some degenerative changes at the AC joint.
Negative visible right chest.
IMPRESSION: Anteriorly dislocated right glenohumeral joint. No acute fracture
identified.

## 2019-10-23 IMAGING — DX DG SHOULDER 2+V PORT*R*
2 series · 2 of 2 positions shown · non-contrast
Comparison: 06/07/2018

CLINICAL DATA: Postreduction right shoulder

EXAM:
PORTABLE RIGHT SHOULDER

[abdomen kub (1 of 2)]
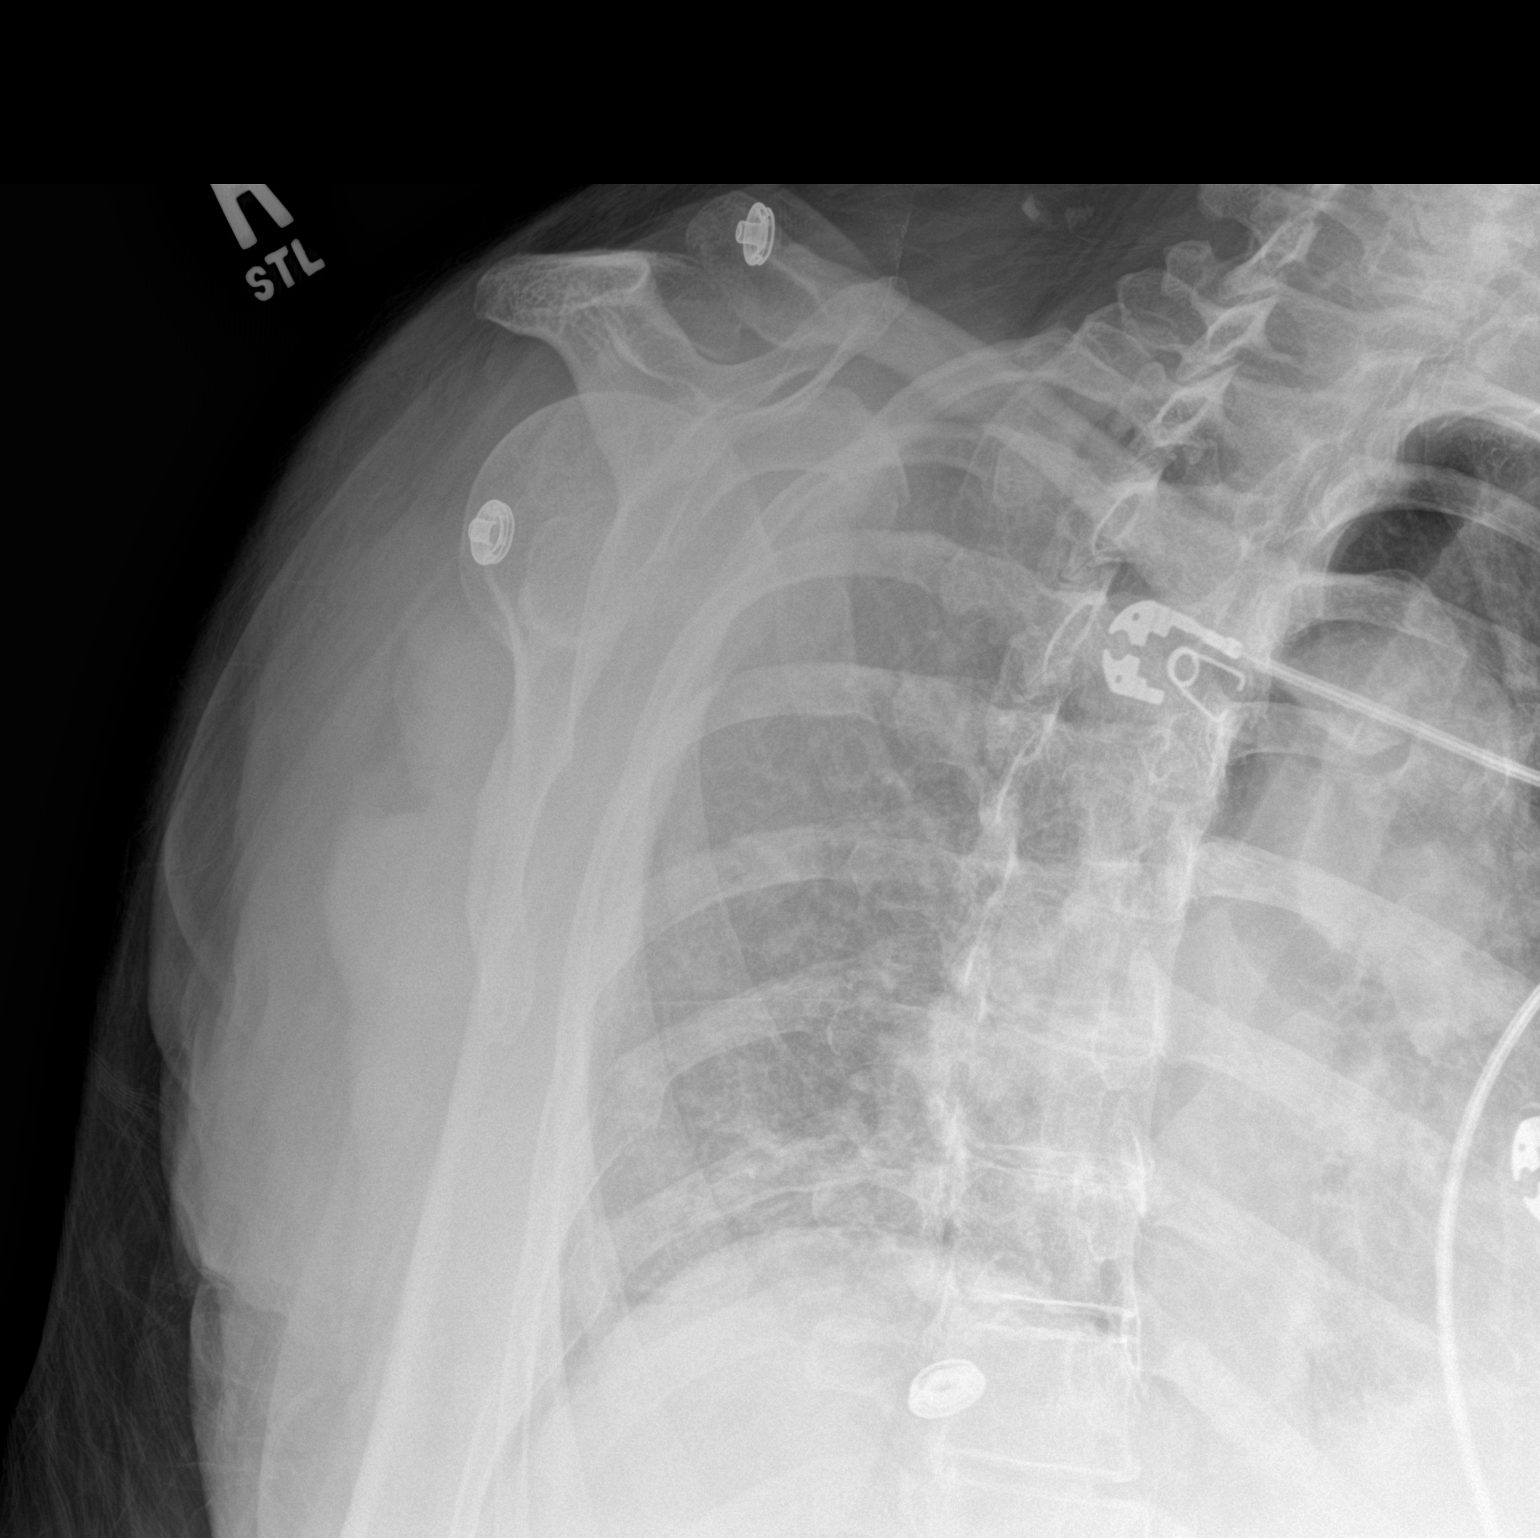

[abdomen kub (2 of 2)]
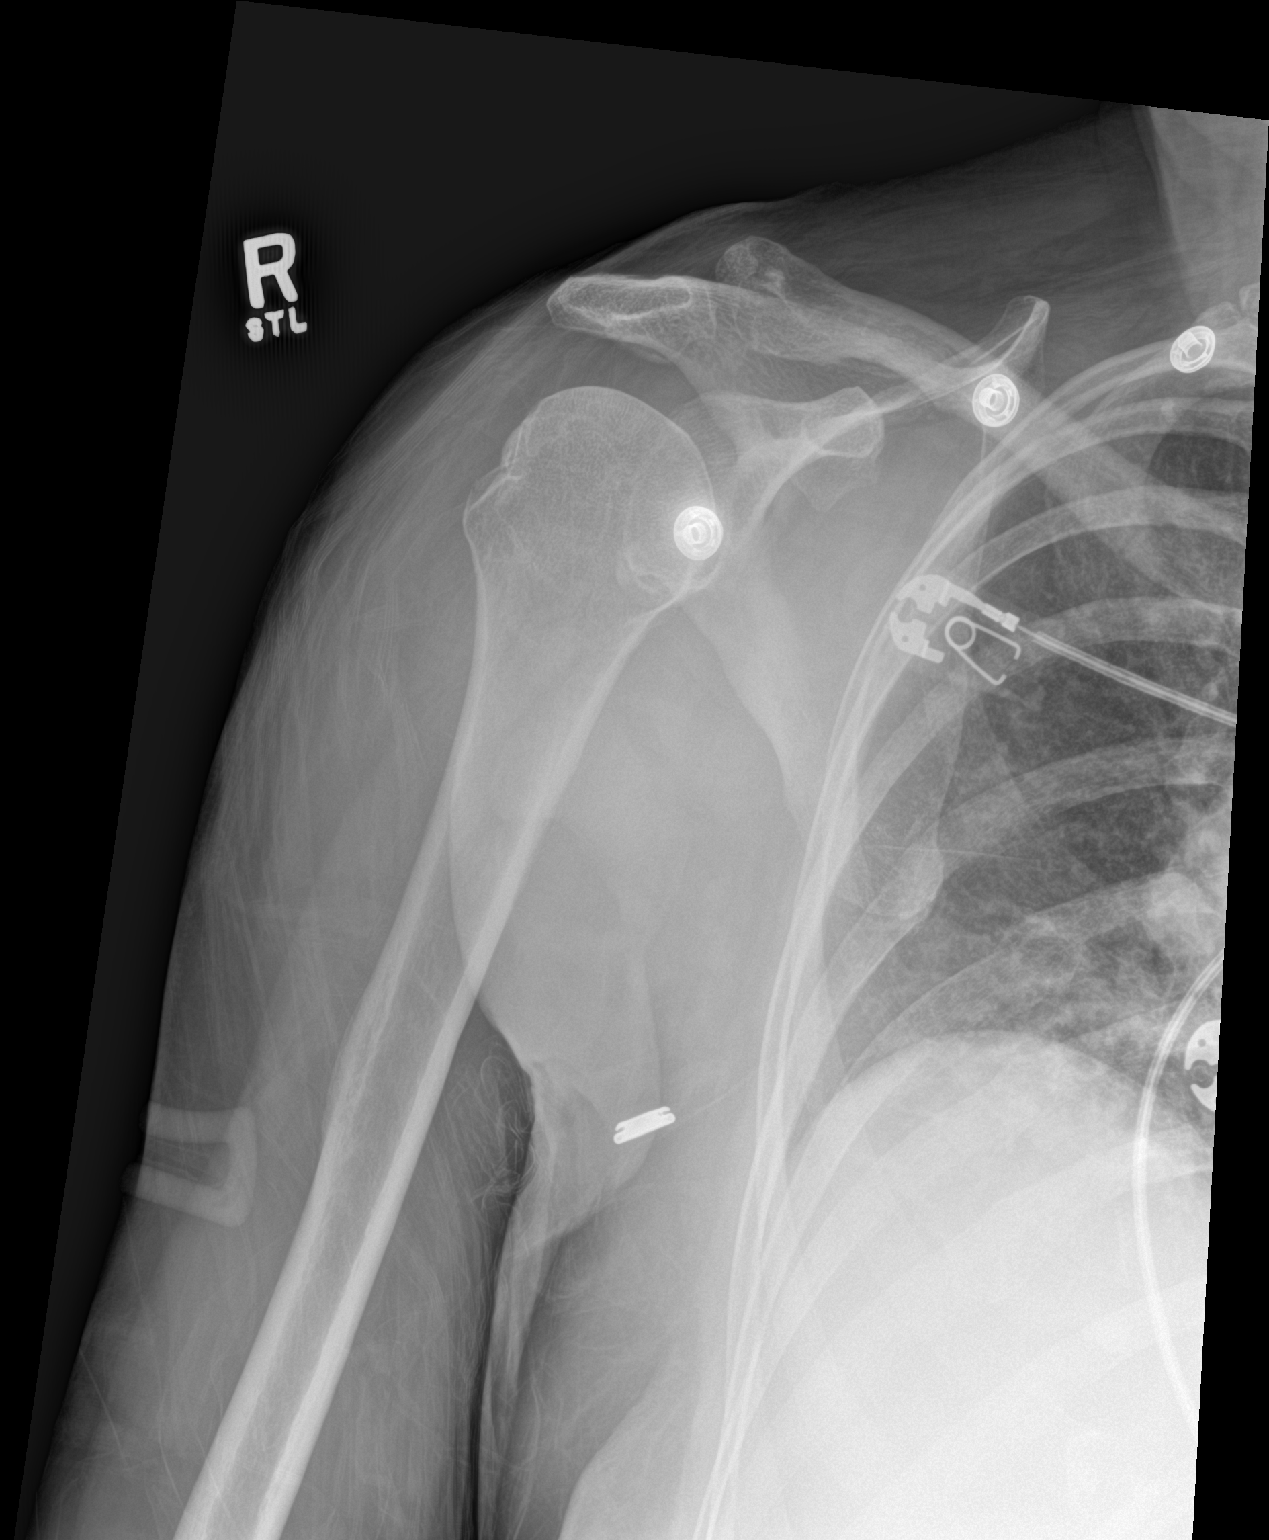

[2 of 2 positions shown; findings below may reference images not displayed]

FINDINGS: Anatomic position of the right shoulder postreduction. Cortical
depression along the lateral humeral head consistent with Hill-Sachs
fracture. There also appears to be irregularity of the inferior
glenoid consistent with Bankart lesion although this is poorly
visualized due to bone overlap. Coracoclavicular and
acromioclavicular spaces are maintained. Soft tissues are
unremarkable. No destructive or expansile bone lesions.
IMPRESSION: Anatomic position of the right shoulder postreduction. Hill-Sachs
and Abbie fractures are identified.

## 2019-11-28 ENCOUNTER — Other Ambulatory Visit: Payer: Self-pay | Admitting: Orthopaedic Surgery

## 2019-12-06 DIAGNOSIS — E039 Hypothyroidism, unspecified: Secondary | ICD-10-CM | POA: Diagnosis not present

## 2019-12-06 DIAGNOSIS — I10 Essential (primary) hypertension: Secondary | ICD-10-CM | POA: Diagnosis not present

## 2019-12-06 DIAGNOSIS — Z0000 Encounter for general adult medical examination without abnormal findings: Secondary | ICD-10-CM | POA: Diagnosis not present

## 2019-12-06 DIAGNOSIS — E785 Hyperlipidemia, unspecified: Secondary | ICD-10-CM | POA: Diagnosis not present

## 2019-12-06 DIAGNOSIS — H35033 Hypertensive retinopathy, bilateral: Secondary | ICD-10-CM | POA: Diagnosis not present

## 2019-12-06 DIAGNOSIS — I700 Atherosclerosis of aorta: Secondary | ICD-10-CM | POA: Diagnosis not present

## 2020-01-30 ENCOUNTER — Other Ambulatory Visit: Payer: PPO

## 2020-01-30 ENCOUNTER — Other Ambulatory Visit: Payer: Self-pay

## 2020-01-30 DIAGNOSIS — Z20822 Contact with and (suspected) exposure to covid-19: Secondary | ICD-10-CM

## 2020-01-31 ENCOUNTER — Other Ambulatory Visit: Payer: Self-pay

## 2020-01-31 LAB — NOVEL CORONAVIRUS, NAA: SARS-CoV-2, NAA: NOT DETECTED

## 2020-01-31 LAB — SARS-COV-2, NAA 2 DAY TAT

## 2020-04-23 DIAGNOSIS — Z01419 Encounter for gynecological examination (general) (routine) without abnormal findings: Secondary | ICD-10-CM | POA: Diagnosis not present

## 2020-04-23 DIAGNOSIS — Z1231 Encounter for screening mammogram for malignant neoplasm of breast: Secondary | ICD-10-CM | POA: Diagnosis not present

## 2020-04-23 DIAGNOSIS — Z6826 Body mass index (BMI) 26.0-26.9, adult: Secondary | ICD-10-CM | POA: Diagnosis not present

## 2020-05-17 DIAGNOSIS — H524 Presbyopia: Secondary | ICD-10-CM | POA: Diagnosis not present

## 2020-05-17 DIAGNOSIS — H35372 Puckering of macula, left eye: Secondary | ICD-10-CM | POA: Diagnosis not present

## 2020-05-17 DIAGNOSIS — H2513 Age-related nuclear cataract, bilateral: Secondary | ICD-10-CM | POA: Diagnosis not present

## 2020-05-17 DIAGNOSIS — H35033 Hypertensive retinopathy, bilateral: Secondary | ICD-10-CM | POA: Diagnosis not present

## 2020-05-17 DIAGNOSIS — H25013 Cortical age-related cataract, bilateral: Secondary | ICD-10-CM | POA: Diagnosis not present

## 2020-06-07 DIAGNOSIS — Z4789 Encounter for other orthopedic aftercare: Secondary | ICD-10-CM | POA: Diagnosis not present

## 2020-06-14 DIAGNOSIS — R350 Frequency of micturition: Secondary | ICD-10-CM | POA: Diagnosis not present

## 2020-06-14 DIAGNOSIS — N3946 Mixed incontinence: Secondary | ICD-10-CM | POA: Diagnosis not present

## 2020-10-27 ENCOUNTER — Other Ambulatory Visit: Payer: Self-pay | Admitting: Orthopaedic Surgery

## 2020-12-17 DIAGNOSIS — E785 Hyperlipidemia, unspecified: Secondary | ICD-10-CM | POA: Diagnosis not present

## 2020-12-17 DIAGNOSIS — Z0000 Encounter for general adult medical examination without abnormal findings: Secondary | ICD-10-CM | POA: Diagnosis not present

## 2020-12-17 DIAGNOSIS — I700 Atherosclerosis of aorta: Secondary | ICD-10-CM | POA: Diagnosis not present

## 2020-12-17 DIAGNOSIS — E039 Hypothyroidism, unspecified: Secondary | ICD-10-CM | POA: Diagnosis not present

## 2020-12-17 DIAGNOSIS — R0602 Shortness of breath: Secondary | ICD-10-CM | POA: Diagnosis not present

## 2020-12-17 DIAGNOSIS — I10 Essential (primary) hypertension: Secondary | ICD-10-CM | POA: Diagnosis not present

## 2020-12-17 DIAGNOSIS — H35033 Hypertensive retinopathy, bilateral: Secondary | ICD-10-CM | POA: Diagnosis not present

## 2020-12-17 DIAGNOSIS — L409 Psoriasis, unspecified: Secondary | ICD-10-CM | POA: Diagnosis not present

## 2020-12-17 DIAGNOSIS — M8580 Other specified disorders of bone density and structure, unspecified site: Secondary | ICD-10-CM | POA: Diagnosis not present

## 2021-05-16 DIAGNOSIS — H02831 Dermatochalasis of right upper eyelid: Secondary | ICD-10-CM | POA: Diagnosis not present

## 2021-05-16 DIAGNOSIS — H11823 Conjunctivochalasis, bilateral: Secondary | ICD-10-CM | POA: Diagnosis not present

## 2021-05-16 DIAGNOSIS — H04123 Dry eye syndrome of bilateral lacrimal glands: Secondary | ICD-10-CM | POA: Diagnosis not present

## 2021-05-16 DIAGNOSIS — H25813 Combined forms of age-related cataract, bilateral: Secondary | ICD-10-CM | POA: Diagnosis not present

## 2021-05-16 DIAGNOSIS — H524 Presbyopia: Secondary | ICD-10-CM | POA: Diagnosis not present

## 2021-06-18 DIAGNOSIS — N390 Urinary tract infection, site not specified: Secondary | ICD-10-CM | POA: Diagnosis not present

## 2021-06-18 DIAGNOSIS — R350 Frequency of micturition: Secondary | ICD-10-CM | POA: Diagnosis not present

## 2021-06-19 DIAGNOSIS — F419 Anxiety disorder, unspecified: Secondary | ICD-10-CM | POA: Diagnosis not present

## 2021-06-19 DIAGNOSIS — Z79899 Other long term (current) drug therapy: Secondary | ICD-10-CM | POA: Diagnosis not present

## 2021-06-19 DIAGNOSIS — E039 Hypothyroidism, unspecified: Secondary | ICD-10-CM | POA: Diagnosis not present

## 2021-06-19 DIAGNOSIS — M1990 Unspecified osteoarthritis, unspecified site: Secondary | ICD-10-CM | POA: Diagnosis not present

## 2021-06-19 DIAGNOSIS — D649 Anemia, unspecified: Secondary | ICD-10-CM | POA: Diagnosis not present

## 2021-06-19 DIAGNOSIS — E7800 Pure hypercholesterolemia, unspecified: Secondary | ICD-10-CM | POA: Diagnosis not present

## 2021-06-19 DIAGNOSIS — I700 Atherosclerosis of aorta: Secondary | ICD-10-CM | POA: Diagnosis not present

## 2021-09-03 ENCOUNTER — Other Ambulatory Visit: Payer: Self-pay | Admitting: Orthopaedic Surgery

## 2021-10-02 DIAGNOSIS — Z1231 Encounter for screening mammogram for malignant neoplasm of breast: Secondary | ICD-10-CM | POA: Diagnosis not present

## 2021-10-31 ENCOUNTER — Ambulatory Visit (INDEPENDENT_AMBULATORY_CARE_PROVIDER_SITE_OTHER): Payer: PPO

## 2021-10-31 ENCOUNTER — Ambulatory Visit (INDEPENDENT_AMBULATORY_CARE_PROVIDER_SITE_OTHER): Payer: PPO | Admitting: Physician Assistant

## 2021-10-31 ENCOUNTER — Encounter: Payer: Self-pay | Admitting: Physician Assistant

## 2021-10-31 DIAGNOSIS — M79604 Pain in right leg: Secondary | ICD-10-CM

## 2021-10-31 MED ORDER — METHYLPREDNISOLONE 4 MG PO TABS: ORAL_TABLET | ORAL | 0 refills | Status: AC

## 2021-10-31 NOTE — Progress Notes (Signed)
Office Visit Note   Patient: Kimberly Johns           Date of Birth: [birth year]           MRN: [id number] Visit Date: 10/31/2021              Requested by: Shirlean Mylar, MD [address] [place],  [place] [address] PCP: Shirlean Mylar, MD   Assessment & Plan: Visit Diagnoses:  1. Pain in right leg     Plan: We will place her on Medrol Dosepak for 6 days.  She is to stop her Celebrex and any other NSAIDs during that time.  We will also send her to formal physical therapy for lumbar spine exercises, range of motion stretching home exercise program and modalities.  She will follow-up with [place] in 4 weeks see how she is doing overall.  Questions were encouraged and answered at length.  Follow-Up Instructions: Return in about 4 weeks (around 11/28/2021).   Orders:  Orders Placed This Encounter  Procedures   XR Lumbar Spine 2-3 Views   XR Pelvis 1-2 Views   Meds ordered this encounter  Medications   methylPREDNISolone (MEDROL) 4 MG tablet    Sig: Take as directed    Dispense:  21 tablet    Refill:  0      Procedures: No procedures performed   Clinical Data: No additional findings.   Subjective: Chief Complaint  Patient presents with   Right Leg - Pain    HPI Kimberly Johns comes in today for right leg pain for the past 2 to 3 months.  She does have a history of bilateral hip replacements.  She is having no groin pain.  No low back pain.  She having some numbness tingling pain down the lateral aspect of the right leg to the ankle.  She states this got worse after playing pickle ball few weeks ago.  At that time that her pain was 9-10 out of 10 at worst.  Currently her pain is 7-8 out of 10 pain at worst.  She denies any saddle anesthesia symptoms, waking pain, bowel or bladder dysfunction, fevers or chills or unintentional weight loss.  Pain is worse with walking.  Review of Systems See HPI otherwise negative  Objective: Vital Signs: There were no vitals  taken for this visit.  Physical Exam Constitutional:      Appearance: She is not ill-appearing or diaphoretic.  Cardiovascular:     Pulses: Normal pulses.  Pulmonary:     Effort: Pulmonary effort is normal.  Neurological:     Mental Status: She is alert and oriented to person, place, and time.     Ortho Exam Lower extremities 5 out of 5 strength throughout against resistance.  Positive straight leg raise on the right negative on the left.  She is able to come within 3 inches of touching her toes.  She has full extension of the lumbar spine without pain.  No tenderness over the lower lumbar spinal column and paraspinous region on the right and left.  Good range of motion bilateral hips without pain. Specialty Comments:  No specialty comments available.  Imaging: XR Lumbar Spine 2-3 Views  Result Date: 10/31/2021 Lumbar spine 2 views: Shows slight scoliosis.  Degenerative changes throughout the lumbar spine with endplate spurring at T12-L1 with near bridging.  Retrolisthesis L3 on 4 slight.  Grade 1 anterior spinal listhesis L4 on L5.  No acute fractures.  XR Pelvis 1-2 Views  Result Date: 10/31/2021 AP pelvis: Bilateral hips well located.  No acute fractures.  Bilateral total hip arthroplasty components well-seated.  No acute findings.    PMFS History: Patient Active Problem List   Diagnosis Date Noted   S/P shoulder replacement, right 07/16/2018   History of removal of retained hardware 12/17/2016   Unilateral primary osteoarthritis, left hip 06/20/2016   Status post left hip replacement 06/20/2016   Osteoarthritis of right hip 12/08/2014   Status post total replacement of right hip 12/08/2014   Abdominal pain, epigastric 07/21/2012   Esophageal reflux 07/21/2012   Personal history of colonic polyps 07/21/2012   Past Medical History:  Diagnosis Date   Anxiety    managed with propanolol    Arthritis    Colon polyps    Diverticulosis    Fracture of ankle, trimalleolar,  left, closed    Gallbladder disease    denies    GERD (gastroesophageal reflux disease)    History of chicken pox    HLD (hyperlipidemia)    Hypothyroidism    Kidney cysts    Liver cyst    Measles    hx of    Mumps    hx of    PONV (postoperative nausea and vomiting)    with general anesthesia    Rotator cuff tear    right    Shingles    hx of     Family History  Problem Relation Age of Onset   Alzheimer's disease Mother 107   Colon cancer Neg Hx    Esophageal cancer Neg Hx    Rectal cancer Neg Hx    Stomach cancer Neg Hx     Past Surgical History:  Procedure Laterality Date   ANKLE FRACTURE SURGERY Left 2009   ANTERIOR CRUCIATE LIGAMENT REPAIR Left 2001   APPENDECTOMY  2010   COLONOSCOPY WITH PROPOFOL N/A 10/30/2014   Procedure: COLONOSCOPY WITH PROPOFOL;  Surgeon: Charolett Bumpers, MD;  Location: [facility];  Service: Endoscopy;  Laterality: N/A;   REVERSE SHOULDER ARTHROPLASTY Right 07/16/2018   Procedure: REVERSE SHOULDER ARTHROPLASTY;  Surgeon: Beverely Low, MD;  Location: [facility];  Service: Orthopedics;  Laterality: Right;   SHOULDER ARTHROSCOPY WITH ROTATOR CUFF REPAIR Right 07/16/2018   Procedure: SHOULDER ARTHROSCOPY;  Surgeon: Beverely Low, MD;  Location: [facility];  Service: Orthopedics;  Laterality: Right;  IS block   TONSILLECTOMY     age 6   TONSILLECTOMY     TOTAL HIP ARTHROPLASTY Right 12/08/2014   Procedure: RIGHT TOTAL HIP ARTHROPLASTY ANTERIOR APPROACH;  Surgeon: Kathryne Hitch, MD;  Location: [facility];  Service: Orthopedics;  Laterality: Right;   TOTAL HIP ARTHROPLASTY Left 06/20/2016   Procedure: LEFT TOTAL HIP ARTHROPLASTY ANTERIOR APPROACH;  Surgeon: Kathryne Hitch, MD;  Location: [facility];  Service: Orthopedics;  Laterality: Left;   Social History   Occupational History   Occupation: radiology tech  Tobacco Use   Smoking status: Former    Packs/day: 0.50    Years: 6.00    Total pack years: 3.00    Types: Cigarettes    Quit date:  10/22/1968    Years since quitting: 53.0   Smokeless tobacco: Never  Vaping Use   Vaping Use: Never used  Substance and Sexual Activity   Alcohol use: No   Drug use: No   Sexual activity: Not on file

## 2021-11-01 NOTE — Addendum Note (Signed)
Addended by: Barbette Or on: 11/01/2021 02:25 PM   Modules accepted: Orders

## 2021-11-06 ENCOUNTER — Encounter: Payer: Self-pay | Admitting: Physical Therapy

## 2021-11-06 ENCOUNTER — Ambulatory Visit: Payer: PPO | Attending: Physician Assistant | Admitting: Physical Therapy

## 2021-11-06 ENCOUNTER — Other Ambulatory Visit: Payer: Self-pay

## 2021-11-06 DIAGNOSIS — M5416 Radiculopathy, lumbar region: Secondary | ICD-10-CM

## 2021-11-06 DIAGNOSIS — M6281 Muscle weakness (generalized): Secondary | ICD-10-CM

## 2021-11-06 DIAGNOSIS — M79604 Pain in right leg: Secondary | ICD-10-CM | POA: Insufficient documentation

## 2021-11-06 NOTE — Therapy (Signed)
OUTPATIENT PHYSICAL THERAPY THORACOLUMBAR EVALUATION   Patient Name: Kimberly Johns MRN: [id number] DOB:18-May-1940, 81 y.o., female Today's Date: 11/06/2021   PT End of Session - 11/06/21 1714     Visit Number 1    Date for PT Re-Evaluation 12/18/21    Authorization Type [employer]    Progress Note Due on Visit 10    PT Start Time 1400    PT Stop Time 1445    PT Time Calculation (min) 45 min    Activity Tolerance Patient tolerated treatment well    Behavior During Therapy WFL for tasks assessed/performed             Past Medical History:  Diagnosis Date   Anxiety    managed with propanolol    Arthritis    Colon polyps    Diverticulosis    Fracture of ankle, trimalleolar, left, closed    Gallbladder disease    denies    GERD (gastroesophageal reflux disease)    History of chicken pox    HLD (hyperlipidemia)    Hypothyroidism    Kidney cysts    Liver cyst    Measles    hx of    Mumps    hx of    PONV (postoperative nausea and vomiting)    with general anesthesia    Rotator cuff tear    right    Shingles    hx of    Past Surgical History:  Procedure Laterality Date   ANKLE FRACTURE SURGERY Left 2009   ANTERIOR CRUCIATE LIGAMENT REPAIR Left 2001   APPENDECTOMY  2010   COLONOSCOPY WITH PROPOFOL N/A 10/30/2014   Procedure: COLONOSCOPY WITH PROPOFOL;  Surgeon: Garlan Fair, MD;  Location: [facility];  Service: Endoscopy;  Laterality: N/A;   REVERSE SHOULDER ARTHROPLASTY Right 07/16/2018   Procedure: REVERSE SHOULDER ARTHROPLASTY;  Surgeon: Netta Cedars, MD;  Location: [facility];  Service: Orthopedics;  Laterality: Right;   SHOULDER ARTHROSCOPY WITH ROTATOR CUFF REPAIR Right 07/16/2018   Procedure: SHOULDER ARTHROSCOPY;  Surgeon: Netta Cedars, MD;  Location: [facility];  Service: Orthopedics;  Laterality: Right;  IS block   TONSILLECTOMY     age 59   TONSILLECTOMY     TOTAL HIP ARTHROPLASTY Right 12/08/2014   Procedure: RIGHT TOTAL HIP ARTHROPLASTY  ANTERIOR APPROACH;  Surgeon: Mcarthur Rossetti, MD;  Location: [facility];  Service: Orthopedics;  Laterality: Right;   TOTAL HIP ARTHROPLASTY Left 06/20/2016   Procedure: LEFT TOTAL HIP ARTHROPLASTY ANTERIOR APPROACH;  Surgeon: Mcarthur Rossetti, MD;  Location: [facility];  Service: Orthopedics;  Laterality: Left;   Patient Active Problem List   Diagnosis Date Noted   S/P shoulder replacement, right 07/16/2018   History of removal of retained hardware 12/17/2016   Unilateral primary osteoarthritis, left hip 06/20/2016   Status post left hip replacement 06/20/2016   Osteoarthritis of right hip 12/08/2014   Status post total replacement of right hip 12/08/2014   Abdominal pain, epigastric 07/21/2012   Esophageal reflux 07/21/2012   Personal history of colonic polyps 07/21/2012      REFERRING PROVIDER: Pete Pelt, PA-C   REFERRING DIAG: [phone] (ICD-10-CM) - Pain in right leg   Rationale for Evaluation and Treatment Rehabilitation  THERAPY DIAG:  Radiculopathy, lumbar region  Muscle weakness (generalized)  ONSET DATE: 2-3 mos ago after playing pickle ball  SUBJECTIVE:  SUBJECTIVE STATEMENT: Pt referred to [facility] with Rt leg pain, numbness and tingling which started 2-3 mos ago after playing pickleball.  No change with a prednisone pack.  Pt does not have any back pain or groin pain.  She has a history of bil THA.    PERTINENT HISTORY:  Bil hip replacements anterior approach 2016 and 2018  PAIN:  Are you having pain?  PAIN:  Are you having pain? Yes NPRS scale: 5/10 Pain location: Rt lateral thigh, leg to lateral ankle Pain orientation: Right  PAIN TYPE: aching, dull, sharp, and numnbess Pain description: intermittent  Aggravating factors: weight bearing, walking up hills, flexing  ankle Relieving factors: laying in bed, topical rub    PRECAUTIONS: None  WEIGHT BEARING RESTRICTIONS No  FALLS:  Has patient fallen in last 6 months? No  LIVING ENVIRONMENT: Lives with: lives alone Lives in: House/apartment Stairs: a few external and 1 flight internal Has following equipment at home: Single point cane on occasion to alleviate Rt LE WB/pain  OCCUPATION: retired  PLOF: Independent  PATIENT GOALS control the pain, be able to walk at [employer], get back to gym classes   OBJECTIVE:   DIAGNOSTIC FINDINGS:  Result Date: 10/31/2021 Lumbar spine 2 views: Shows slight scoliosis.  Degenerative changes throughout the lumbar spine with endplate spurring at D34-534 with near bridging.  Retrolisthesis L3 on 4 slight. Grade 1 anterior spinal listhesis L4 on L5.  No acute fractures. Result Date: 10/31/2021 AP pelvis: Bilateral hips well located.  No acute fractures.  Bilateral total hip arthroplasty components well-seated.  No acute findings.   PATIENT SURVEYS:  FOTO 64% goal 72%  SCREENING FOR RED FLAGS: Bowel or bladder incontinence: No Spinal tumors: No Cauda equina syndrome: No Compression fracture: No Abdominal aneurysm: No  COGNITION:  Overall cognitive status: Within functional limits for tasks assessed     SENSATION: Pt reports intermittent tingling and numbness down Rt lateral leg and thigh  MUSCLE LENGTH: Hamstrings: Right 65 deg; Left 75 deg Limited bil gluteals and piriformis  POSTURE:  mild scoliosis present  PALPATION: Non-tender along lumbar spine and Rt lateral thigh and leg  LUMBAR ROM:   Active  A/PROM  eval  Flexion Fingers to mid shin  Extension WNL  Right lateral flexion WNL  Left lateral flexion WNL  Right rotation WNL  Left rotation WNL   (Blank rows = not tested)  LOWER EXTREMITY ROM:     Passive  Right eval Left eval  Hip flexion 95 95  Hip extension    Hip abduction    Hip adduction    Hip internal rotation     Hip external rotation 50 50  Knee flexion    Knee extension    Ankle dorsiflexion    Ankle plantarflexion    Ankle inversion    Ankle eversion     (Blank rows = not tested)  LOWER EXTREMITY MMT:    MMT Right eval Left eval  Hip flexion 4 4  Hip extension 4 4  Hip abduction 4- 4-  Hip adduction 4 4  Hip internal rotation 4- 4-  Hip external rotation 4- 4-  Knee flexion 4 4  Knee extension 4 4  Ankle dorsiflexion 4+ 4+  Ankle plantarflexion    Ankle inversion    Ankle eversion 4+ 4+   (Blank rows = not tested)  LUMBAR SPECIAL TESTS:  Straight leg raise test: Negative  FUNCTIONAL TESTS:  Functional squat bil knee valgus , able to balance in  SLS  GAIT: Distance walked: clinic Assistive device utilized: None Level of assistance: Complete Independence Comments: gait WFL    TODAY'S TREATMENT  11/06/21  (evaluation) Initiated HEP   PATIENT EDUCATION:  Education details: Access Code: [email or web address] Person educated: Patient Education method: Programmer, multimedia, Facilities manager, and Handouts Education comprehension: verbalized understanding and returned demonstration   HOME EXERCISE PROGRAM: Access Code: [email or web address] URL: [email or web address] Date: 11/06/2021 Prepared by: Loistine Simas Debra Colon  Exercises - Supine Hamstring Stretch  - 1 x daily - 7 x weekly - 1 sets - 2 reps - 30 hold - Supine Sciatic Nerve Glide  - 1 x daily - 7 x weekly - 1 sets - 30 reps - Supine Double Knee to Chest  - 1 x daily - 7 x weekly - 1 sets - 2 reps - 30 hold - Supine Piriformis Stretch with Foot on Ground  - 1 x daily - 7 x weekly - 1 sets - 2 reps - 30 hold - Supine Figure 4 Piriformis Stretch  - 1 x daily - 7 x weekly - 1 sets - 2 reps - 30 hold - Hooklying Clamshell with Resistance  - 1 x daily - 7 x weekly - 2 sets - 20 reps - Clamshell with Resistance  - 1 x daily - 7 x weekly - 1 sets - 10 reps  ASSESSMENT:  CLINICAL IMPRESSION: Patient is an 81 y.o. female who was seen today  for physical therapy evaluation and treatment for Rt LE pain, tingling and numbness which began 2-3 mos ago after playing pickle ball. She reports worsening symptoms with WB and activities that require ankle DF such as walking up hills.  Pt was symptom-free during evaluation today so unable to determine whether she had positional preference.  Flexion and extension did not bring on symptoms today although she felt the symptoms with trial of bridges during session.  PT explained that she may try prone on elbows to see if this reduces symptoms when they come on to see if she has an extension bias.  Pt with limited hip ROM, LE flexibility, hip and core strength, and demos uncontrolled bil knee valgus with squat.  She hopes to get back into gym exercise classes for seniors.   OBJECTIVE IMPAIRMENTS decreased ROM, decreased strength, impaired flexibility, impaired sensation, improper body mechanics, and pain.   ACTIVITY LIMITATIONS standing, squatting, and locomotion level  PARTICIPATION LIMITATIONS: community activity  PERSONAL FACTORS 1 comorbidity: bil THA  are also affecting patient's functional outcome.   REHAB POTENTIAL: Good  CLINICAL DECISION MAKING: Stable/uncomplicated  EVALUATION COMPLEXITY: Low   GOALS: Goals reviewed with patient? Yes  SHORT TERM GOALS: Target date: 11/27/2021  Pt will be ind with initial HEP Baseline: Goal status: INITIAL  2.  Pt will report at least 20% improvement in Rt LE symptoms with WB tasks and with exercise walks. Baseline:  Goal status: INITIAL  3.  Pt will improve LE flexibility to WNL Baseline:  Goal status: INITIAL  4.  Pt will demo functional squat x 10 reps with good knee alignment Baseline: bil knee valgus Goal status: INITIAL    LONG TERM GOALS: Target date: 12/18/2021  Pt will be ind with advanced HEP and understand how to safely progress. Baseline:  Goal status: INITIAL  2.  Pt will report at least 70% reduction in Rt LE symptoms  with WB tasks and exercise walking. Baseline:  Goal status: INITIAL  3.  Improve FOTO score to at least 72% to demo improved function. Baseline: 64% Goal  status: INITIAL  4.  Pt will achieve core and LE strength of at least 4+/5 for improved postural support and functional task performance. Baseline:  Goal status: INITIAL   PLAN: PT FREQUENCY: 2x/week  PT DURATION: 6 weeks  PLANNED INTERVENTIONS: Therapeutic exercises, Therapeutic activity, Neuromuscular re-education, Patient/Family education, Joint mobilization, Dry Needling, Electrical stimulation, Spinal mobilization, Cryotherapy, Moist heat, and Manual therapy.  PLAN FOR NEXT SESSION: review HEP and initiate core stabilization (bird dog, dead bug) and progress LE strength as tol, monitor for positional preference if Rt LE radicular symptoms come on during session   Johnette Teigen, PT 11/06/21 5:16 PM

## 2021-11-11 ENCOUNTER — Ambulatory Visit: Payer: PPO | Admitting: Physical Therapy

## 2021-11-11 ENCOUNTER — Encounter: Payer: Self-pay | Admitting: Physical Therapy

## 2021-11-11 DIAGNOSIS — M6281 Muscle weakness (generalized): Secondary | ICD-10-CM

## 2021-11-11 DIAGNOSIS — M79604 Pain in right leg: Secondary | ICD-10-CM | POA: Diagnosis not present

## 2021-11-11 DIAGNOSIS — M5416 Radiculopathy, lumbar region: Secondary | ICD-10-CM

## 2021-11-11 NOTE — Therapy (Signed)
OUTPATIENT PHYSICAL THERAPY TREATMENT NOTE   Patient Name: Kimberly Johns MRN: [id number] DOB:1941/04/30, 81 y.o., female Today's Date: 11/11/2021  PCP: Shirlean Mylar, MD REFERRING PROVIDER:  Kirtland Bouchard, PA-C   END OF SESSION:   PT End of Session - 11/11/21 1400     Visit Number 2    Date for PT Re-Evaluation 12/18/21    Authorization Type [employer]    Progress Note Due on Visit 10    PT Start Time 1400    PT Stop Time 1441    PT Time Calculation (min) 41 min    Activity Tolerance Patient tolerated treatment well    Behavior During Therapy WFL for tasks assessed/performed             Past Medical History:  Diagnosis Date   Anxiety    managed with propanolol    Arthritis    Colon polyps    Diverticulosis    Fracture of ankle, trimalleolar, left, closed    Gallbladder disease    denies    GERD (gastroesophageal reflux disease)    History of chicken pox    HLD (hyperlipidemia)    Hypothyroidism    Kidney cysts    Liver cyst    Measles    hx of    Mumps    hx of    PONV (postoperative nausea and vomiting)    with general anesthesia    Rotator cuff tear    right    Shingles    hx of    Past Surgical History:  Procedure Laterality Date   ANKLE FRACTURE SURGERY Left 2009   ANTERIOR CRUCIATE LIGAMENT REPAIR Left 2001   APPENDECTOMY  2010   COLONOSCOPY WITH PROPOFOL N/A 10/30/2014   Procedure: COLONOSCOPY WITH PROPOFOL;  Surgeon: Charolett Bumpers, MD;  Location: [facility];  Service: Endoscopy;  Laterality: N/A;   REVERSE SHOULDER ARTHROPLASTY Right 07/16/2018   Procedure: REVERSE SHOULDER ARTHROPLASTY;  Surgeon: Beverely Low, MD;  Location: [facility];  Service: Orthopedics;  Laterality: Right;   SHOULDER ARTHROSCOPY WITH ROTATOR CUFF REPAIR Right 07/16/2018   Procedure: SHOULDER ARTHROSCOPY;  Surgeon: Beverely Low, MD;  Location: [facility];  Service: Orthopedics;  Laterality: Right;  IS block   TONSILLECTOMY     age 15   TONSILLECTOMY     TOTAL  HIP ARTHROPLASTY Right 12/08/2014   Procedure: RIGHT TOTAL HIP ARTHROPLASTY ANTERIOR APPROACH;  Surgeon: Kathryne Hitch, MD;  Location: [facility];  Service: Orthopedics;  Laterality: Right;   TOTAL HIP ARTHROPLASTY Left 06/20/2016   Procedure: LEFT TOTAL HIP ARTHROPLASTY ANTERIOR APPROACH;  Surgeon: Kathryne Hitch, MD;  Location: [facility];  Service: Orthopedics;  Laterality: Left;   Patient Active Problem List   Diagnosis Date Noted   S/P shoulder replacement, right 07/16/2018   History of removal of retained hardware 12/17/2016   Unilateral primary osteoarthritis, left hip 06/20/2016   Status post left hip replacement 06/20/2016   Osteoarthritis of right hip 12/08/2014   Status post total replacement of right hip 12/08/2014   Abdominal pain, epigastric 07/21/2012   Esophageal reflux 07/21/2012   Personal history of colonic polyps 07/21/2012    REFERRING DIAG: M79.604 (ICD-10-CM) - Pain in right leg     THERAPY DIAG:  Radiculopathy, lumbar region  Muscle weakness (generalized)  Rationale for Evaluation and Treatment Rehabilitation  PERTINENT HISTORY: Bil hip replacements anterior approach 2016 and 2018  PRECAUTIONS: None  SUBJECTIVE: I am doing my exercises and they are going well.  PAIN:  Are you having pain? No, but in weightbearing I had some leg pain earlier.    OBJECTIVE: (objective measures completed at initial evaluation unless otherwise dated)  OBJECTIVE:    DIAGNOSTIC FINDINGS:  Result Date: 10/31/2021 Lumbar spine 2 views: Shows slight scoliosis.  Degenerative changes throughout the lumbar spine with endplate spurring at T12-L1 with near bridging.  Retrolisthesis L3 on 4 slight. Grade 1 anterior spinal listhesis L4 on L5.  No acute fractures. Result Date: 10/31/2021 AP pelvis: Bilateral hips well located.  No acute fractures.  Bilateral total hip arthroplasty components well-seated.  No acute findings.    PATIENT SURVEYS:  FOTO 64% goal 72%    SCREENING FOR RED FLAGS: Bowel or bladder incontinence: No Spinal tumors: No Cauda equina syndrome: No Compression fracture: No Abdominal aneurysm: No   COGNITION:           Overall cognitive status: Within functional limits for tasks assessed                          SENSATION: Pt reports intermittent tingling and numbness down Rt lateral leg and thigh   MUSCLE LENGTH: Hamstrings: Right 65 deg; Left 75 deg Limited bil gluteals and piriformis   POSTURE:  mild scoliosis present   PALPATION: Non-tender along lumbar spine and Rt lateral thigh and leg   LUMBAR ROM:    Active  A/PROM  eval  Flexion Fingers to mid shin  Extension WNL  Right lateral flexion WNL  Left lateral flexion WNL  Right rotation WNL  Left rotation WNL   (Blank rows = not tested)   LOWER EXTREMITY ROM:      Passive  Right eval Left eval  Hip flexion 95 95  Hip extension      Hip abduction      Hip adduction      Hip internal rotation      Hip external rotation 50 50  Knee flexion      Knee extension      Ankle dorsiflexion      Ankle plantarflexion      Ankle inversion      Ankle eversion       (Blank rows = not tested)   LOWER EXTREMITY MMT:     MMT Right eval Left eval  Hip flexion 4 4  Hip extension 4 4  Hip abduction 4- 4-  Hip adduction 4 4  Hip internal rotation 4- 4-  Hip external rotation 4- 4-  Knee flexion 4 4  Knee extension 4 4  Ankle dorsiflexion 4+ 4+  Ankle plantarflexion      Ankle inversion      Ankle eversion 4+ 4+   (Blank rows = not tested)   LUMBAR SPECIAL TESTS:  Straight leg raise test: Negative   FUNCTIONAL TESTS:  Functional squat bil knee valgus , able to balance in SLS   GAIT: Distance walked: clinic Assistive device utilized: None Level of assistance: Complete Independence Comments: gait WFL       TODAY'S TREATMENT    11/11/21: Nustep L1 x concurrent review of status. Review and performance of HEP;  DKC 2x20sec, RT hamstring  stretch with strap 2x20 sec, Rt piriformis stretch 2x20 sec, Sciatic nerve flossing 10x, hooklying clamshell with green band 2x10 S/L hip clamshells with green band 10x2; Vc to engage TA first.  Added dying bug to HEP: Pt with correct demo of each. Pt needs to only lift foot on  the RTLE to begin with  to reduce LE groin symptoms.    11/06/21  (evaluation) Initiated HEP     PATIENT EDUCATION:  Education details: Access Code: [email or web address] Person educated: Patient Education method: Explanation, Demonstration, and Handouts Education comprehension: verbalized understanding and returned demonstration     HOME EXERCISE PROGRAM: Access Code: [email or web address] URL: [email or web address] Date: 11/06/2021 Prepared by: Loistine Simas Beuhring   Exercises - Supine Hamstring Stretch  - 1 x daily - 7 x weekly - 1 sets - 2 reps - 30 hold - Supine Sciatic Nerve Glide  - 1 x daily - 7 x weekly - 1 sets - 30 reps - Supine Double Knee to Chest  - 1 x daily - 7 x weekly - 1 sets - 2 reps - 30 hold - Supine Piriformis Stretch with Foot on Ground  - 1 x daily - 7 x weekly - 1 sets - 2 reps - 30 hold - Supine Figure 4 Piriformis Stretch  - 1 x daily - 7 x weekly - 1 sets - 2 reps - 30 hold - Hooklying Clamshell with Resistance  - 1 x daily - 7 x weekly - 2 sets - 20 reps - Clamshell with Resistance  - 1 x daily - 7 x weekly - 1 sets - 10 reps Adding Dying bug: 11/11/21 Ane Payment, PTA 11/11/21 2:42 PM    ASSESSMENT:   CLINICAL IMPRESSION: Pt arrives with no complaints of pain or LE symptoms. She does report RTLE symptoms earlier this AM. Pt is independent and compliant in initial HEP. Pt has difficulty keeping lower abdominals engaged as she performs resisted clamshell. Pt needs a smaller LE lift on the RT LE dying bug.   OBJECTIVE IMPAIRMENTS decreased ROM, decreased strength, impaired flexibility, impaired sensation, improper body mechanics, and pain.    ACTIVITY LIMITATIONS standing, squatting, and  locomotion level   PARTICIPATION LIMITATIONS: community activity   PERSONAL FACTORS 1 comorbidity: bil THA  are also affecting patient's functional outcome.    REHAB POTENTIAL: Good   CLINICAL DECISION MAKING: Stable/uncomplicated   EVALUATION COMPLEXITY: Low     GOALS: Goals reviewed with patient? Yes   SHORT TERM GOALS: Target date: 11/27/2021   Pt will be ind with initial HEP Baseline: Goal status: INITIAL   2.  Pt will report at least 20% improvement in Rt LE symptoms with WB tasks and with exercise walks. Baseline:  Goal status: INITIAL   3.  Pt will improve LE flexibility to WNL Baseline:  Goal status: INITIAL   4.  Pt will demo functional squat x 10 reps with good knee alignment Baseline: bil knee valgus Goal status: INITIAL       LONG TERM GOALS: Target date: 12/18/2021   Pt will be ind with advanced HEP and understand how to safely progress. Baseline:  Goal status: INITIAL   2.  Pt will report at least 70% reduction in Rt LE symptoms with WB tasks and exercise walking. Baseline:  Goal status: INITIAL   3.  Improve FOTO score to at least 72% to demo improved function. Baseline: 64% Goal status: INITIAL   4.  Pt will achieve core and LE strength of at least 4+/5 for improved postural support and functional task performance. Baseline:  Goal status: INITIAL     PLAN: PT FREQUENCY: 2x/week   PT DURATION: 6 weeks   PLANNED INTERVENTIONS: Therapeutic exercises, Therapeutic activity, Neuromuscular re-education, Patient/Family education, Joint mobilization, Dry Needling, Electrical stimulation, Spinal mobilization, Cryotherapy, Moist heat, and Manual therapy.  PLAN FOR NEXT SESSION:  Work on dying bug and other lower abdominal engaging exercises.  Saifullah Jolley, PTA 11/11/2021, 2:42 PM

## 2021-11-14 ENCOUNTER — Ambulatory Visit: Payer: PPO | Admitting: Physical Therapy

## 2021-11-14 ENCOUNTER — Encounter: Payer: Self-pay | Admitting: Physical Therapy

## 2021-11-14 DIAGNOSIS — M5416 Radiculopathy, lumbar region: Secondary | ICD-10-CM

## 2021-11-14 DIAGNOSIS — M79604 Pain in right leg: Secondary | ICD-10-CM | POA: Diagnosis not present

## 2021-11-14 DIAGNOSIS — M6281 Muscle weakness (generalized): Secondary | ICD-10-CM

## 2021-11-14 NOTE — Therapy (Signed)
OUTPATIENT PHYSICAL THERAPY TREATMENT NOTE   Patient Name: Kimberly Johns MRN: [id number] DOB:June 24, 1940, 81 y.o., female Today's Date: 11/14/2021  PCP: Shirlean Mylar, MD REFERRING PROVIDER:  Kirtland Bouchard, PA-C   END OF SESSION:   PT End of Session - 11/14/21 0931     Visit Number 3    Date for PT Re-Evaluation 12/18/21    Authorization Type [employer]    Progress Note Due on Visit 10    PT Start Time 0931    PT Stop Time 1015    PT Time Calculation (min) 44 min    Activity Tolerance Patient tolerated treatment well    Behavior During Therapy WFL for tasks assessed/performed              Past Medical History:  Diagnosis Date   Anxiety    managed with propanolol    Arthritis    Colon polyps    Diverticulosis    Fracture of ankle, trimalleolar, left, closed    Gallbladder disease    denies    GERD (gastroesophageal reflux disease)    History of chicken pox    HLD (hyperlipidemia)    Hypothyroidism    Kidney cysts    Liver cyst    Measles    hx of    Mumps    hx of    PONV (postoperative nausea and vomiting)    with general anesthesia    Rotator cuff tear    right    Shingles    hx of    Past Surgical History:  Procedure Laterality Date   ANKLE FRACTURE SURGERY Left 2009   ANTERIOR CRUCIATE LIGAMENT REPAIR Left 2001   APPENDECTOMY  2010   COLONOSCOPY WITH PROPOFOL N/A 10/30/2014   Procedure: COLONOSCOPY WITH PROPOFOL;  Surgeon: Charolett Bumpers, MD;  Location: [facility];  Service: Endoscopy;  Laterality: N/A;   REVERSE SHOULDER ARTHROPLASTY Right 07/16/2018   Procedure: REVERSE SHOULDER ARTHROPLASTY;  Surgeon: Beverely Low, MD;  Location: [facility];  Service: Orthopedics;  Laterality: Right;   SHOULDER ARTHROSCOPY WITH ROTATOR CUFF REPAIR Right 07/16/2018   Procedure: SHOULDER ARTHROSCOPY;  Surgeon: Beverely Low, MD;  Location: [facility];  Service: Orthopedics;  Laterality: Right;  IS block   TONSILLECTOMY     age 65   TONSILLECTOMY     TOTAL  HIP ARTHROPLASTY Right 12/08/2014   Procedure: RIGHT TOTAL HIP ARTHROPLASTY ANTERIOR APPROACH;  Surgeon: Kathryne Hitch, MD;  Location: [facility];  Service: Orthopedics;  Laterality: Right;   TOTAL HIP ARTHROPLASTY Left 06/20/2016   Procedure: LEFT TOTAL HIP ARTHROPLASTY ANTERIOR APPROACH;  Surgeon: Kathryne Hitch, MD;  Location: [facility];  Service: Orthopedics;  Laterality: Left;   Patient Active Problem List   Diagnosis Date Noted   S/P shoulder replacement, right 07/16/2018   History of removal of retained hardware 12/17/2016   Unilateral primary osteoarthritis, left hip 06/20/2016   Status post left hip replacement 06/20/2016   Osteoarthritis of right hip 12/08/2014   Status post total replacement of right hip 12/08/2014   Abdominal pain, epigastric 07/21/2012   Esophageal reflux 07/21/2012   Personal history of colonic polyps 07/21/2012    REFERRING DIAG: M79.604 (ICD-10-CM) - Pain in right leg     THERAPY DIAG:  Radiculopathy, lumbar region  Muscle weakness (generalized)  Rationale for Evaluation and Treatment Rehabilitation  PERTINENT HISTORY: Bil hip replacements anterior approach 2016 and 2018  PRECAUTIONS: None  SUBJECTIVE: I am aware that my Rt leg is weaker than  my Lt.  I have trouble getting my leg up and over my low bar of my bicycle.  The leg pain comes and goes and I can adjust how I bear my weight to reduce it.  I did try the prone press up once when the leg pain was bad and it did seem to help.    PAIN:  Are you having pain? No, but in weightbearing I had some leg pain earlier.    OBJECTIVE: (objective measures completed at initial evaluation unless otherwise dated)  OBJECTIVE:    DIAGNOSTIC FINDINGS:  Result Date: 10/31/2021 Lumbar spine 2 views: Shows slight scoliosis.  Degenerative changes throughout the lumbar spine with endplate spurring at T12-L1 with near bridging.  Retrolisthesis L3 on 4 slight. Grade 1 anterior spinal listhesis L4 on L5.   No acute fractures. Result Date: 10/31/2021 AP pelvis: Bilateral hips well located.  No acute fractures.  Bilateral total hip arthroplasty components well-seated.  No acute findings.    PATIENT SURVEYS:  FOTO 64% goal 72%   SCREENING FOR RED FLAGS: Bowel or bladder incontinence: No Spinal tumors: No Cauda equina syndrome: No Compression fracture: No Abdominal aneurysm: No   COGNITION:           Overall cognitive status: Within functional limits for tasks assessed                          SENSATION: Pt reports intermittent tingling and numbness down Rt lateral leg and thigh   MUSCLE LENGTH: Hamstrings: Right 65 deg; Left 75 deg Limited bil gluteals and piriformis   POSTURE:  mild scoliosis present   PALPATION: Non-tender along lumbar spine and Rt lateral thigh and leg   LUMBAR ROM:    Active  A/PROM  eval  Flexion Fingers to mid shin  Extension WNL  Right lateral flexion WNL  Left lateral flexion WNL  Right rotation WNL  Left rotation WNL   (Blank rows = not tested)   LOWER EXTREMITY ROM:      Passive  Right eval Left eval  Hip flexion 95 95  Hip extension      Hip abduction      Hip adduction      Hip internal rotation      Hip external rotation 50 50  Knee flexion      Knee extension      Ankle dorsiflexion      Ankle plantarflexion      Ankle inversion      Ankle eversion       (Blank rows = not tested)   LOWER EXTREMITY MMT:     MMT Right eval Left eval  Hip flexion 4 4  Hip extension 4 4  Hip abduction 4- 4-  Hip adduction 4 4  Hip internal rotation 4- 4-  Hip external rotation 4- 4-  Knee flexion 4 4  Knee extension 4 4  Ankle dorsiflexion 4+ 4+  Ankle plantarflexion      Ankle inversion      Ankle eversion 4+ 4+   (Blank rows = not tested)   LUMBAR SPECIAL TESTS:  Straight leg raise test: Negative   FUNCTIONAL TESTS:  Functional squat bil knee valgus , able to balance in SLS   GAIT: Distance walked: clinic Assistive device  utilized: None Level of assistance: Complete Independence Comments: gait WFL       TODAY'S TREATMENT  11/14/21: NuStep L3 seat 7 x 5' PT present to discuss  status Supine 1x30" each: Rt hamstring stretch with strap, Rt fig 4 and piriformis Dying bug: modified to ipsilateral march with arm raise x 20, TC and VC at abdominals SL Rt clamshell 2x10 Supine pilates bridge 2x10 Sit to stand from mat table with tied green tband x 15 reps with mirror for feedback (Rt knee valgus control) Sidestepping with tied green band around ankles across mirror and back x 5 laps  Counter hip abduction 2x10 bil LE Sidestep up 4" with single UE support x 12 each side   11/11/21: Nustep L1 x concurrent review of status. Review and performance of HEP;  DKC 2x20sec, RT hamstring stretch with strap 2x20 sec, Rt piriformis stretch 2x20 sec, Sciatic nerve flossing 10x, hooklying clamshell with green band 2x10 S/L hip clamshells with green band 10x2; Vc to engage TA first.  Added dying bug to HEP: Pt with correct demo of each. Pt needs to only lift foot on the RTLE to begin with  to reduce LE groin symptoms.    11/06/21  (evaluation) Initiated HEP     PATIENT EDUCATION:  Education details: Access Code: [email or web address] Person educated: Patient Education method: Explanation, Demonstration, and Handouts Education comprehension: verbalized understanding and returned demonstration     HOME EXERCISE PROGRAM: Access Code: [email or web address] URL: [email or web address] Date: 11/14/2021 Prepared by: Loistine Simas Jeris Easterly  Exercises - Supine Hamstring Stretch  - 1 x daily - 7 x weekly - 1 sets - 2 reps - 30 hold - Supine Sciatic Nerve Glide  - 1 x daily - 7 x weekly - 1 sets - 30 reps - Supine Double Knee to Chest  - 1 x daily - 7 x weekly - 1 sets - 2 reps - 30 hold - Supine Piriformis Stretch with Foot on Ground  - 1 x daily - 7 x weekly - 1 sets - 2 reps - 30 hold - Supine Figure 4 Piriformis Stretch  - 1 x daily - 7  x weekly - 1 sets - 2 reps - 30 hold - Hooklying Clamshell with Resistance  - 1 x daily - 7 x weekly - 2 sets - 20 reps - Clamshell with Resistance  - 1 x daily - 7 x weekly - 1 sets - 10 reps - Supine Dead Bug with Leg Extension  - 1 x daily - 7 x weekly - 2 sets - 10 reps - Sit to Stand with Resistance Around Legs  - 1 x daily - 7 x weekly - 3 sets - 10 reps - Lateral Step Up  - 1 x daily - 7 x weekly - 3 sets - 10 reps - Pilates Bridge  - 1 x daily - 7 x weekly - 3 sets - 10 reps   ASSESSMENT:   CLINICAL IMPRESSION:   Pt with signif weakness in bil hips (abd/ext) and has trouble controlling Rt knee valgus with sit to stand and stand to sit.  She has limited awareness of how to recruit in both open and closed chain.  PT updated HEP for hip and functional strength today adding sit to stand with band around knees, lateral step up and pilates bridge.  Pt needed to modify dying bug to ipsilateral UE/LE march to avoid groin pain. Continue along POC  OBJECTIVE IMPAIRMENTS decreased ROM, decreased strength, impaired flexibility, impaired sensation, improper body mechanics, and pain.    ACTIVITY LIMITATIONS standing, squatting, and locomotion level   PARTICIPATION LIMITATIONS: community activity   PERSONAL FACTORS 1 comorbidity: bil THA  are also  affecting patient's functional outcome.    REHAB POTENTIAL: Good   CLINICAL DECISION MAKING: Stable/uncomplicated   EVALUATION COMPLEXITY: Low     GOALS: Goals reviewed with patient? Yes   SHORT TERM GOALS: Target date: 11/27/2021   Pt will be ind with initial HEP Baseline: Goal status: INITIAL   2.  Pt will report at least 20% improvement in Rt LE symptoms with WB tasks and with exercise walks. Baseline:  Goal status: INITIAL   3.  Pt will improve LE flexibility to WNL Baseline:  Goal status: INITIAL   4.  Pt will demo functional squat x 10 reps with good knee alignment Baseline: bil knee valgus Goal status: INITIAL       LONG  TERM GOALS: Target date: 12/18/2021   Pt will be ind with advanced HEP and understand how to safely progress. Baseline:  Goal status: INITIAL   2.  Pt will report at least 70% reduction in Rt LE symptoms with WB tasks and exercise walking. Baseline:  Goal status: INITIAL   3.  Improve FOTO score to at least 72% to demo improved function. Baseline: 64% Goal status: INITIAL   4.  Pt will achieve core and LE strength of at least 4+/5 for improved postural support and functional task performance. Baseline:  Goal status: INITIAL     PLAN: PT FREQUENCY: 2x/week   PT DURATION: 6 weeks   PLANNED INTERVENTIONS: Therapeutic exercises, Therapeutic activity, Neuromuscular re-education, Patient/Family education, Joint mobilization, Dry Needling, Electrical stimulation, Spinal mobilization, Cryotherapy, Moist heat, and Manual therapy.   PLAN FOR NEXT SESSION:  Work on dying bug and other lower abdominal engaging exercises.  Betzaira Mentel E Shaley Leavens, PT 11/14/2021, 10:16 AM

## 2021-11-20 ENCOUNTER — Ambulatory Visit: Payer: PPO | Admitting: Physical Therapy

## 2021-11-20 ENCOUNTER — Encounter: Payer: Self-pay | Admitting: Physical Therapy

## 2021-11-20 DIAGNOSIS — M5416 Radiculopathy, lumbar region: Secondary | ICD-10-CM

## 2021-11-20 DIAGNOSIS — M6281 Muscle weakness (generalized): Secondary | ICD-10-CM

## 2021-11-20 DIAGNOSIS — M79604 Pain in right leg: Secondary | ICD-10-CM | POA: Diagnosis not present

## 2021-11-20 NOTE — Therapy (Signed)
OUTPATIENT PHYSICAL THERAPY TREATMENT NOTE   Patient Name: Kimberly Johns MRN: [id number] DOB:23-Aug-1940, 81 y.o., female Today's Date: 11/20/2021  PCP: Shirlean Mylar, MD REFERRING PROVIDER:  Kirtland Bouchard, PA-C   END OF SESSION:   PT End of Session - 11/20/21 0934     Visit Number 4    Date for PT Re-Evaluation 12/18/21    Authorization Type [employer]    Progress Note Due on Visit 10    PT Start Time 0932    PT Stop Time 1014    PT Time Calculation (min) 42 min    Activity Tolerance Patient tolerated treatment well    Behavior During Therapy WFL for tasks assessed/performed               Past Medical History:  Diagnosis Date   Anxiety    managed with propanolol    Arthritis    Colon polyps    Diverticulosis    Fracture of ankle, trimalleolar, left, closed    Gallbladder disease    denies    GERD (gastroesophageal reflux disease)    History of chicken pox    HLD (hyperlipidemia)    Hypothyroidism    Kidney cysts    Liver cyst    Measles    hx of    Mumps    hx of    PONV (postoperative nausea and vomiting)    with general anesthesia    Rotator cuff tear    right    Shingles    hx of    Past Surgical History:  Procedure Laterality Date   ANKLE FRACTURE SURGERY Left 2009   ANTERIOR CRUCIATE LIGAMENT REPAIR Left 2001   APPENDECTOMY  2010   COLONOSCOPY WITH PROPOFOL N/A 10/30/2014   Procedure: COLONOSCOPY WITH PROPOFOL;  Surgeon: Charolett Bumpers, MD;  Location: [facility];  Service: Endoscopy;  Laterality: N/A;   REVERSE SHOULDER ARTHROPLASTY Right 07/16/2018   Procedure: REVERSE SHOULDER ARTHROPLASTY;  Surgeon: Beverely Low, MD;  Location: [facility];  Service: Orthopedics;  Laterality: Right;   SHOULDER ARTHROSCOPY WITH ROTATOR CUFF REPAIR Right 07/16/2018   Procedure: SHOULDER ARTHROSCOPY;  Surgeon: Beverely Low, MD;  Location: [facility];  Service: Orthopedics;  Laterality: Right;  IS block   TONSILLECTOMY     age 16   TONSILLECTOMY      TOTAL HIP ARTHROPLASTY Right 12/08/2014   Procedure: RIGHT TOTAL HIP ARTHROPLASTY ANTERIOR APPROACH;  Surgeon: Kathryne Hitch, MD;  Location: [facility];  Service: Orthopedics;  Laterality: Right;   TOTAL HIP ARTHROPLASTY Left 06/20/2016   Procedure: LEFT TOTAL HIP ARTHROPLASTY ANTERIOR APPROACH;  Surgeon: Kathryne Hitch, MD;  Location: [facility];  Service: Orthopedics;  Laterality: Left;   Patient Active Problem List   Diagnosis Date Noted   S/P shoulder replacement, right 07/16/2018   History of removal of retained hardware 12/17/2016   Unilateral primary osteoarthritis, left hip 06/20/2016   Status post left hip replacement 06/20/2016   Osteoarthritis of right hip 12/08/2014   Status post total replacement of right hip 12/08/2014   Abdominal pain, epigastric 07/21/2012   Esophageal reflux 07/21/2012   Personal history of colonic polyps 07/21/2012    REFERRING DIAG: M79.604 (ICD-10-CM) - Pain in right leg     THERAPY DIAG:  Radiculopathy, lumbar region  Muscle weakness (generalized)  Rationale for Evaluation and Treatment Rehabilitation  PERTINENT HISTORY: Bil hip replacements anterior approach 2016 and 2018  PRECAUTIONS: None  SUBJECTIVE: the exercises are helping.  I take Celebrex in  AM which eliminates my pain until about 4pm when it comes back.  I have pain when climbing the stairs at night to go to bed.   PAIN:  Are you having pain? No, but in weightbearing I had some leg pain earlier.    OBJECTIVE: (objective measures completed at initial evaluation unless otherwise dated)  OBJECTIVE:    DIAGNOSTIC FINDINGS:  Result Date: 10/31/2021 Lumbar spine 2 views: Shows slight scoliosis.  Degenerative changes throughout the lumbar spine with endplate spurring at T12-L1 with near bridging.  Retrolisthesis L3 on 4 slight. Grade 1 anterior spinal listhesis L4 on L5.  No acute fractures. Result Date: 10/31/2021 AP pelvis: Bilateral hips well located.  No acute fractures.   Bilateral total hip arthroplasty components well-seated.  No acute findings.    PATIENT SURVEYS:  FOTO 64% goal 72%   SCREENING FOR RED FLAGS: Bowel or bladder incontinence: No Spinal tumors: No Cauda equina syndrome: No Compression fracture: No Abdominal aneurysm: No   COGNITION:           Overall cognitive status: Within functional limits for tasks assessed                          SENSATION: Pt reports intermittent tingling and numbness down Rt lateral leg and thigh   MUSCLE LENGTH: Hamstrings: Right 65 deg; Left 75 deg Limited bil gluteals and piriformis   POSTURE:  mild scoliosis present   PALPATION: Non-tender along lumbar spine and Rt lateral thigh and leg   LUMBAR ROM:    Active  A/PROM  eval  Flexion Fingers to mid shin  Extension WNL  Right lateral flexion WNL  Left lateral flexion WNL  Right rotation WNL  Left rotation WNL   (Blank rows = not tested)   LOWER EXTREMITY ROM:      Passive  Right eval Left eval  Hip flexion 95 95  Hip extension      Hip abduction      Hip adduction      Hip internal rotation      Hip external rotation 50 50  Knee flexion      Knee extension      Ankle dorsiflexion      Ankle plantarflexion      Ankle inversion      Ankle eversion       (Blank rows = not tested)   LOWER EXTREMITY MMT:     MMT Right eval Left eval  Hip flexion 4 4  Hip extension 4 4  Hip abduction 4- 4-  Hip adduction 4 4  Hip internal rotation 4- 4-  Hip external rotation 4- 4-  Knee flexion 4 4  Knee extension 4 4  Ankle dorsiflexion 4+ 4+  Ankle plantarflexion      Ankle inversion      Ankle eversion 4+ 4+   (Blank rows = not tested)   LUMBAR SPECIAL TESTS:  Straight leg raise test: Negative   FUNCTIONAL TESTS:  Functional squat bil knee valgus , able to balance in SLS   GAIT: Distance walked: clinic Assistive device utilized: None Level of assistance: Complete Independence Comments: gait WFL       TODAY'S TREATMENT   11/20/21: NuStep L4 x 6' PT present to discuss symptom behavior Supine 2x30" Rt hamstring stretch with strap Supine Rt fig 4 and piriformis hooklying stretch 2x30" Pelvic tilts with TC/VC for coordination with breathing pattern x 20 Supine bridge with yellow loop clam  x 15 Dying bug opp arm/leg 1x10 (needed review of sequencing) SL hip abduction 1x10 each side Chair sit up holding bil 2lb dumbbells at chest   11/14/21: NuStep L3 seat 7 x 5' PT present to discuss status Supine 1x30" each: Rt hamstring stretch with strap, Rt fig 4 and piriformis Dying bug: modified to ipsilateral march with arm raise x 20, TC and VC at abdominals SL Rt clamshell 2x10 Supine pilates bridge 2x10 Sit to stand from mat table with tied green tband x 15 reps with mirror for feedback (Rt knee valgus control) Sidestepping with tied green band around ankles across mirror and back x 5 laps  Counter hip abduction 2x10 bil LE Sidestep up 4" with single UE support x 12 each side   11/11/21: Nustep L1 x concurrent review of status. Review and performance of HEP;  DKC 2x20sec, RT hamstring stretch with strap 2x20 sec, Rt piriformis stretch 2x20 sec, Sciatic nerve flossing 10x, hooklying clamshell with green band 2x10 S/L hip clamshells with green band 10x2; Vc to engage TA first.  Added dying bug to HEP: Pt with correct demo of each. Pt needs to only lift foot on the RTLE to begin with  to reduce LE groin symptoms.       PATIENT EDUCATION:  Education details: Access Code: [email or web address] Person educated: Patient Education method: Explanation, Demonstration, and Handouts Education comprehension: verbalized understanding and returned demonstration     HOME EXERCISE PROGRAM: Access Code: [email or web address] URL: [email or web address] Date: 11/14/2021 Prepared by: Loistine Simas Camy Leder  Exercises - Supine Hamstring Stretch  - 1 x daily - 7 x weekly - 1 sets - 2 reps - 30 hold - Supine Sciatic Nerve Glide  - 1 x  daily - 7 x weekly - 1 sets - 30 reps - Supine Double Knee to Chest  - 1 x daily - 7 x weekly - 1 sets - 2 reps - 30 hold - Supine Piriformis Stretch with Foot on Ground  - 1 x daily - 7 x weekly - 1 sets - 2 reps - 30 hold - Supine Figure 4 Piriformis Stretch  - 1 x daily - 7 x weekly - 1 sets - 2 reps - 30 hold - Hooklying Clamshell with Resistance  - 1 x daily - 7 x weekly - 2 sets - 20 reps - Clamshell with Resistance  - 1 x daily - 7 x weekly - 1 sets - 10 reps - Supine Dead Bug with Leg Extension  - 1 x daily - 7 x weekly - 2 sets - 10 reps - Sit to Stand with Resistance Around Legs  - 1 x daily - 7 x weekly - 3 sets - 10 reps - Lateral Step Up  - 1 x daily - 7 x weekly - 3 sets - 10 reps - Pilates Bridge  - 1 x daily - 7 x weekly - 3 sets - 10 reps   ASSESSMENT:   CLINICAL IMPRESSION:   Pt has been compliant with HEP and pain is well managed with HEP and Celebrex in AM until approx 4pm when pain sets back in.  She has pain climbing stairs end of day to go to bed.  She continues to work on strength and coordination of breathwork with therex in sessions.  Weakness present in bil hip abd and ext, Rt>Lt.  She has anterior hip pain on Rt when stepping over bicycle bar to ride.  PT progressed hip strength to bridge with bil clam and SL  hip abd today but did not progress HEP yet due to Pt still getting used to last week's new therex.    OBJECTIVE IMPAIRMENTS decreased ROM, decreased strength, impaired flexibility, impaired sensation, improper body mechanics, and pain.    ACTIVITY LIMITATIONS standing, squatting, and locomotion level   PARTICIPATION LIMITATIONS: community activity   PERSONAL FACTORS 1 comorbidity: bil THA  are also affecting patient's functional outcome.    REHAB POTENTIAL: Good   CLINICAL DECISION MAKING: Stable/uncomplicated   EVALUATION COMPLEXITY: Low     GOALS: Goals reviewed with patient? Yes   SHORT TERM GOALS: Target date: 11/27/2021   Pt will be ind with  initial HEP Baseline: Goal status: INITIAL   2.  Pt will report at least 20% improvement in Rt LE symptoms with WB tasks and with exercise walks. Baseline:  Goal status: INITIAL   3.  Pt will improve LE flexibility to WNL Baseline:  Goal status: INITIAL   4.  Pt will demo functional squat x 10 reps with good knee alignment Baseline: bil knee valgus Goal status: INITIAL       LONG TERM GOALS: Target date: 12/18/2021   Pt will be ind with advanced HEP and understand how to safely progress. Baseline:  Goal status: INITIAL   2.  Pt will report at least 70% reduction in Rt LE symptoms with WB tasks and exercise walking. Baseline:  Goal status: INITIAL   3.  Improve FOTO score to at least 72% to demo improved function. Baseline: 64% Goal status: INITIAL   4.  Pt will achieve core and LE strength of at least 4+/5 for improved postural support and functional task performance. Baseline:  Goal status: INITIAL     PLAN: PT FREQUENCY: 2x/week   PT DURATION: 6 weeks   PLANNED INTERVENTIONS: Therapeutic exercises, Therapeutic activity, Neuromuscular re-education, Patient/Family education, Joint mobilization, Dry Needling, Electrical stimulation, Spinal mobilization, Cryotherapy, Moist heat, and Manual therapy.   PLAN FOR NEXT SESSION:  Work on dying bug and other lower abdominal engaging exercises.  Terrisa Curfman, PT 11/20/21 10:15 AM

## 2021-11-25 ENCOUNTER — Ambulatory Visit: Payer: PPO | Admitting: Physician Assistant

## 2021-11-26 ENCOUNTER — Other Ambulatory Visit: Payer: Self-pay | Admitting: Orthopaedic Surgery

## 2021-11-27 ENCOUNTER — Encounter: Payer: PPO | Admitting: Physical Therapy

## 2021-11-28 ENCOUNTER — Encounter: Payer: Self-pay | Admitting: Physical Therapy

## 2021-11-28 ENCOUNTER — Ambulatory Visit: Payer: PPO | Admitting: Physical Therapy

## 2021-11-28 DIAGNOSIS — M79604 Pain in right leg: Secondary | ICD-10-CM | POA: Diagnosis not present

## 2021-11-28 DIAGNOSIS — M6281 Muscle weakness (generalized): Secondary | ICD-10-CM

## 2021-11-28 DIAGNOSIS — M5416 Radiculopathy, lumbar region: Secondary | ICD-10-CM

## 2021-11-28 NOTE — Therapy (Signed)
OUTPATIENT PHYSICAL THERAPY TREATMENT NOTE   Patient Name: Kimberly Johns MRN: [id number] DOB:08-19-1940, 81 y.o., female Today's Date: 11/28/2021  PCP: Maurice Small, MD REFERRING PROVIDER:  Pete Pelt, PA-C   END OF SESSION:   PT End of Session - 11/28/21 1018     Visit Number 5    Date for PT Re-Evaluation 12/18/21    Authorization Type [employer]    Progress Note Due on Visit 10    PT Start Time 1015    PT Stop Time 1100    PT Time Calculation (min) 45 min    Activity Tolerance Patient tolerated treatment well    Behavior During Therapy WFL for tasks assessed/performed                Past Medical History:  Diagnosis Date   Anxiety    managed with propanolol    Arthritis    Colon polyps    Diverticulosis    Fracture of ankle, trimalleolar, left, closed    Gallbladder disease    denies    GERD (gastroesophageal reflux disease)    History of chicken pox    HLD (hyperlipidemia)    Hypothyroidism    Kidney cysts    Liver cyst    Measles    hx of    Mumps    hx of    PONV (postoperative nausea and vomiting)    with general anesthesia    Rotator cuff tear    right    Shingles    hx of    Past Surgical History:  Procedure Laterality Date   ANKLE FRACTURE SURGERY Left 2009   ANTERIOR CRUCIATE LIGAMENT REPAIR Left 2001   APPENDECTOMY  2010   COLONOSCOPY WITH PROPOFOL N/A 10/30/2014   Procedure: COLONOSCOPY WITH PROPOFOL;  Surgeon: Garlan Fair, MD;  Location: [facility];  Service: Endoscopy;  Laterality: N/A;   REVERSE SHOULDER ARTHROPLASTY Right 07/16/2018   Procedure: REVERSE SHOULDER ARTHROPLASTY;  Surgeon: Netta Cedars, MD;  Location: [facility];  Service: Orthopedics;  Laterality: Right;   SHOULDER ARTHROSCOPY WITH ROTATOR CUFF REPAIR Right 07/16/2018   Procedure: SHOULDER ARTHROSCOPY;  Surgeon: Netta Cedars, MD;  Location: [facility];  Service: Orthopedics;  Laterality: Right;  IS block   TONSILLECTOMY     age 61   TONSILLECTOMY      TOTAL HIP ARTHROPLASTY Right 12/08/2014   Procedure: RIGHT TOTAL HIP ARTHROPLASTY ANTERIOR APPROACH;  Surgeon: Mcarthur Rossetti, MD;  Location: [facility];  Service: Orthopedics;  Laterality: Right;   TOTAL HIP ARTHROPLASTY Left 06/20/2016   Procedure: LEFT TOTAL HIP ARTHROPLASTY ANTERIOR APPROACH;  Surgeon: Mcarthur Rossetti, MD;  Location: [facility];  Service: Orthopedics;  Laterality: Left;   Patient Active Problem List   Diagnosis Date Noted   S/P shoulder replacement, right 07/16/2018   History of removal of retained hardware 12/17/2016   Unilateral primary osteoarthritis, left hip 06/20/2016   Status post left hip replacement 06/20/2016   Osteoarthritis of right hip 12/08/2014   Status post total replacement of right hip 12/08/2014   Abdominal pain, epigastric 07/21/2012   Esophageal reflux 07/21/2012   Personal history of colonic polyps 07/21/2012    REFERRING DIAG: M79.604 (ICD-10-CM) - Pain in right leg     THERAPY DIAG:  Radiculopathy, lumbar region  Muscle weakness (generalized)  Rationale for Evaluation and Treatment Rehabilitation  PERTINENT HISTORY: Bil hip replacements anterior approach 2016 and 2018  PRECAUTIONS: None  SUBJECTIVE: I continue to need an afternoon Celebrex due  to pain as the day progresses.  I can tell I'm getting stronger and I no longer have any severe pain.  My lumbar spine stretches help.  Overall 25% improvement so far.    PAIN:  3-4/10   OBJECTIVE: (objective measures completed at initial evaluation unless otherwise dated)  OBJECTIVE:    DIAGNOSTIC FINDINGS:  Result Date: 10/31/2021 Lumbar spine 2 views: Shows slight scoliosis.  Degenerative changes throughout the lumbar spine with endplate spurring at E17-E0 with near bridging.  Retrolisthesis L3 on 4 slight. Grade 1 anterior spinal listhesis L4 on L5.  No acute fractures. Result Date: 10/31/2021 AP pelvis: Bilateral hips well located.  No acute fractures.  Bilateral total hip  arthroplasty components well-seated.  No acute findings.    PATIENT SURVEYS:  Eval: FOTO 64% goal 72% 11/28/21: FOTO 56%, regressed but contradictory to Pt subjective report of 25% improved   SCREENING FOR RED FLAGS: Bowel or bladder incontinence: No Spinal tumors: No Cauda equina syndrome: No Compression fracture: No Abdominal aneurysm: No   COGNITION:           Overall cognitive status: Within functional limits for tasks assessed                          SENSATION: Pt reports intermittent tingling and numbness down Rt lateral leg and thigh   MUSCLE LENGTH: Hamstrings: Right 65 deg; Left 75 deg Limited bil gluteals and piriformis   POSTURE:  mild scoliosis present   PALPATION: Non-tender along lumbar spine and Rt lateral thigh and leg   LUMBAR ROM:    Active  A/PROM  eval  Flexion Fingers to mid shin  Extension WNL  Right lateral flexion WNL  Left lateral flexion WNL  Right rotation WNL  Left rotation WNL   (Blank rows = not tested)   LOWER EXTREMITY ROM:      Passive  Right eval Left eval  Hip flexion 95 95  Hip extension      Hip abduction      Hip adduction      Hip internal rotation      Hip external rotation 50 50  Knee flexion      Knee extension      Ankle dorsiflexion      Ankle plantarflexion      Ankle inversion      Ankle eversion       (Blank rows = not tested)   LOWER EXTREMITY MMT:     MMT Right eval Left eval  Hip flexion 4 4  Hip extension 4 4  Hip abduction 4- 4-  Hip adduction 4 4  Hip internal rotation 4- 4-  Hip external rotation 4- 4-  Knee flexion 4 4  Knee extension 4 4  Ankle dorsiflexion 4+ 4+  Ankle plantarflexion      Ankle inversion      Ankle eversion 4+ 4+   (Blank rows = not tested)   LUMBAR SPECIAL TESTS:  Straight leg raise test: Negative   FUNCTIONAL TESTS:  Functional squat bil knee valgus , able to balance in SLS   GAIT: Distance walked: clinic Assistive device utilized: None Level of  assistance: Complete Independence Comments: gait WFL       TODAY'S TREATMENT  11/28/21: NuStep L5 x 6' PT present to discuss progress Seated blue physioball trunk flexion and flexion/SB bil x 5 each way Supine pelvic tilts, dead bugs x 10 each SL hip abd 2x10 bil Sit  to stand red loop band around knees hold 10lb x 15 reps from mat table, mirror for Lt knee control feedback Seated Lt clam red loop x 20 Standing deadlift 5lb to stool x 20 Resisted backward walking 10lb x 10 laps FOTO survey  11/20/21: NuStep L4 x 6' PT present to discuss symptom behavior Supine 2x30" Rt hamstring stretch with strap Supine Rt fig 4 and piriformis hooklying stretch 2x30" Pelvic tilts with TC/VC for coordination with breathing pattern x 20 Supine bridge with yellow loop clam x 15 Dying bug opp arm/leg 1x10 (needed review of sequencing) SL hip abduction 1x10 each side Chair sit up holding bil 2lb dumbbells at chest   11/14/21: NuStep L3 seat 7 x 5' PT present to discuss status Supine 1x30" each: Rt hamstring stretch with strap, Rt fig 4 and piriformis Dying bug: modified to ipsilateral march with arm raise x 20, TC and VC at abdominals SL Rt clamshell 2x10 Supine pilates bridge 2x10 Sit to stand from mat table with tied green tband x 15 reps with mirror for feedback (Rt knee valgus control) Sidestepping with tied green band around ankles across mirror and back x 5 laps  Counter hip abduction 2x10 bil LE Sidestep up 4" with single UE support x 12 each side     PATIENT EDUCATION:  Education details: Access Code: [email or web address] Person educated: Patient Education method: Explanation, Demonstration, and Handouts Education comprehension: verbalized understanding and returned demonstration     HOME EXERCISE PROGRAM: Access Code: [email or web address] URL: [email or web address] Date: 11/28/2021 Prepared by: Venetia Night Shubham Thackston  Exercises - Supine Hamstring Stretch  - 1 x daily - 7 x weekly - 1 sets - 2  reps - 30 hold - Supine Sciatic Nerve Glide  - 1 x daily - 7 x weekly - 1 sets - 30 reps - Supine Double Knee to Chest  - 1 x daily - 7 x weekly - 1 sets - 2 reps - 30 hold - Supine Piriformis Stretch with Foot on Ground  - 1 x daily - 7 x weekly - 1 sets - 2 reps - 30 hold - Supine Figure 4 Piriformis Stretch  - 1 x daily - 7 x weekly - 1 sets - 2 reps - 30 hold - Hooklying Clamshell with Resistance  - 1 x daily - 7 x weekly - 2 sets - 20 reps - Clamshell with Resistance  - 1 x daily - 7 x weekly - 1 sets - 10 reps - Supine Dead Bug with Leg Extension  - 1 x daily - 7 x weekly - 2 sets - 10 reps - Sit to Stand with Resistance Around Legs  - 1 x daily - 7 x weekly - 3 sets - 10 reps - Lateral Step Up  - 1 x daily - 7 x weekly - 3 sets - 10 reps - Pilates Bridge  - 1 x daily - 7 x weekly - 3 sets - 10 reps - Sidelying Hip Abduction  - 1 x daily - 7 x weekly - 2 sets - 10 reps - Standing Deadlift with Barbell - Knees Straight  - 1 x daily - 7 x weekly - 2 sets - 15 reps   ASSESSMENT:   CLINICAL IMPRESSION:   Pt reports 25% improvement in symptoms since starting PT.  She no longer has any severe pain.  She notes lumbar stretching is relieving which suggests more flexion as positional preference.  She went bowling and did not have any pain with this.  She is working on hip strength and continues to be challenged with hip abd recruitment Lt>Rt.  PT progressed therex and HEP today to further target standing trunk functional mobility, strength with resisted walking and standing modified dead lifts.  Pt needs heavy VC and TC for proper hip abd positioning in SL to avoid rolling pelvis back and flexing LE beyond line of trunk.    OBJECTIVE IMPAIRMENTS decreased ROM, decreased strength, impaired flexibility, impaired sensation, improper body mechanics, and pain.    ACTIVITY LIMITATIONS standing, squatting, and locomotion level   PARTICIPATION LIMITATIONS: community activity   PERSONAL FACTORS 1  comorbidity: bil THA  are also affecting patient's functional outcome.    REHAB POTENTIAL: Good   CLINICAL DECISION MAKING: Stable/uncomplicated   EVALUATION COMPLEXITY: Low     GOALS: Goals reviewed with patient? Yes   SHORT TERM GOALS: Target date: 11/27/2021   Pt will be ind with initial HEP Baseline: Goal status: met   2.  Pt will report at least 20% improvement in Rt LE symptoms with WB tasks and with exercise walks. Baseline:  Goal status: met, 25% improvement  11/28/21   3.  Pt will improve LE flexibility to WNL Baseline:  Goal status: met   4.  Pt will demo functional squat x 10 reps with good knee alignment Baseline: bil knee valgus Goal status: met       LONG TERM GOALS: Target date: 12/18/2021   Pt will be ind with advanced HEP and understand how to safely progress. Baseline:  Goal status: INITIAL   2.  Pt will report at least 70% reduction in Rt LE symptoms with WB tasks and exercise walking. Baseline:  Goal status: ongoing   3.  Improve FOTO score to at least 72% to demo improved function. Baseline: 64%, 57% on 11/28/21 (regressed but Pt reports she is 25% better) Goal status: ongoing   4.  Pt will achieve core and LE strength of at least 4+/5 for improved postural support and functional task performance. Baseline:  Goal status: ongoing     PLAN: PT FREQUENCY: 2x/week   PT DURATION: 6 weeks   PLANNED INTERVENTIONS: Therapeutic exercises, Therapeutic activity, Neuromuscular re-education, Patient/Family education, Joint mobilization, Dry Needling, Electrical stimulation, Spinal mobilization, Cryotherapy, Moist heat, and Manual therapy.   PLAN FOR NEXT SESSION:  Work on dying bug and other lower abdominal engaging exercises.  Terin Dierolf, PT 11/28/21 12:25 PM

## 2021-12-03 ENCOUNTER — Ambulatory Visit: Payer: PPO | Attending: Physician Assistant | Admitting: Physical Therapy

## 2021-12-03 ENCOUNTER — Encounter: Payer: Self-pay | Admitting: Physical Therapy

## 2021-12-03 DIAGNOSIS — R262 Difficulty in walking, not elsewhere classified: Secondary | ICD-10-CM | POA: Diagnosis not present

## 2021-12-03 DIAGNOSIS — M6281 Muscle weakness (generalized): Secondary | ICD-10-CM | POA: Insufficient documentation

## 2021-12-03 DIAGNOSIS — M5416 Radiculopathy, lumbar region: Secondary | ICD-10-CM | POA: Insufficient documentation

## 2021-12-03 NOTE — Therapy (Signed)
OUTPATIENT PHYSICAL THERAPY TREATMENT NOTE   Patient Name: Kimberly Johns MRN: [id number] DOB:August 27, 1940, 81 y.o., female Today's Date: 12/03/2021  PCP: Maurice Small, MD REFERRING PROVIDER:  Pete Pelt, PA-C   END OF SESSION:   PT End of Session - 12/03/21 0933     Visit Number 6    Date for PT Re-Evaluation 12/18/21    Authorization Type [employer]    Progress Note Due on Visit 10    PT Start Time 0930    PT Stop Time 1015    PT Time Calculation (min) 45 min    Activity Tolerance Patient tolerated treatment well                 Past Medical History:  Diagnosis Date   Anxiety    managed with propanolol    Arthritis    Colon polyps    Diverticulosis    Fracture of ankle, trimalleolar, left, closed    Gallbladder disease    denies    GERD (gastroesophageal reflux disease)    History of chicken pox    HLD (hyperlipidemia)    Hypothyroidism    Kidney cysts    Liver cyst    Measles    hx of    Mumps    hx of    PONV (postoperative nausea and vomiting)    with general anesthesia    Rotator cuff tear    right    Shingles    hx of    Past Surgical History:  Procedure Laterality Date   ANKLE FRACTURE SURGERY Left 2009   ANTERIOR CRUCIATE LIGAMENT REPAIR Left 2001   APPENDECTOMY  2010   COLONOSCOPY WITH PROPOFOL N/A 10/30/2014   Procedure: COLONOSCOPY WITH PROPOFOL;  Surgeon: Garlan Fair, MD;  Location: [facility];  Service: Endoscopy;  Laterality: N/A;   REVERSE SHOULDER ARTHROPLASTY Right 07/16/2018   Procedure: REVERSE SHOULDER ARTHROPLASTY;  Surgeon: Netta Cedars, MD;  Location: [facility];  Service: Orthopedics;  Laterality: Right;   SHOULDER ARTHROSCOPY WITH ROTATOR CUFF REPAIR Right 07/16/2018   Procedure: SHOULDER ARTHROSCOPY;  Surgeon: Netta Cedars, MD;  Location: [facility];  Service: Orthopedics;  Laterality: Right;  IS block   TONSILLECTOMY     age 52   TONSILLECTOMY     TOTAL HIP ARTHROPLASTY Right 12/08/2014   Procedure: RIGHT  TOTAL HIP ARTHROPLASTY ANTERIOR APPROACH;  Surgeon: Mcarthur Rossetti, MD;  Location: [facility];  Service: Orthopedics;  Laterality: Right;   TOTAL HIP ARTHROPLASTY Left 06/20/2016   Procedure: LEFT TOTAL HIP ARTHROPLASTY ANTERIOR APPROACH;  Surgeon: Mcarthur Rossetti, MD;  Location: [facility];  Service: Orthopedics;  Laterality: Left;   Patient Active Problem List   Diagnosis Date Noted   S/P shoulder replacement, right 07/16/2018   History of removal of retained hardware 12/17/2016   Unilateral primary osteoarthritis, left hip 06/20/2016   Status post left hip replacement 06/20/2016   Osteoarthritis of right hip 12/08/2014   Status post total replacement of right hip 12/08/2014   Abdominal pain, epigastric 07/21/2012   Esophageal reflux 07/21/2012   Personal history of colonic polyps 07/21/2012    REFERRING DIAG: M79.604 (ICD-10-CM) - Pain in right leg     THERAPY DIAG:  Radiculopathy, lumbar region  Muscle weakness (generalized)  Rationale for Evaluation and Treatment Rehabilitation  PERTINENT HISTORY: Bil hip replacements anterior approach 2016 and 2018  PRECAUTIONS: None  SUBJECTIVE: Celebrex continues to help my pain as it progresses in late afternoon.  I like the hip  abduction in SL.  It is helped me feel the muscle more.  I think I'm doing it better now.   PAIN:  3-4/10   OBJECTIVE: (objective measures completed at initial evaluation unless otherwise dated)  OBJECTIVE:    DIAGNOSTIC FINDINGS:  Result Date: 10/31/2021 Lumbar spine 2 views: Shows slight scoliosis.  Degenerative changes throughout the lumbar spine with endplate spurring at F02-O3 with near bridging.  Retrolisthesis L3 on 4 slight. Grade 1 anterior spinal listhesis L4 on L5.  No acute fractures. Result Date: 10/31/2021 AP pelvis: Bilateral hips well located.  No acute fractures.  Bilateral total hip arthroplasty components well-seated.  No acute findings.    PATIENT SURVEYS:  Eval: FOTO 64% goal  72% 11/28/21: FOTO 56%, regressed but contradictory to Pt subjective report of 25% improved   SCREENING FOR RED FLAGS: Bowel or bladder incontinence: No Spinal tumors: No Cauda equina syndrome: No Compression fracture: No Abdominal aneurysm: No   COGNITION:           Overall cognitive status: Within functional limits for tasks assessed                          SENSATION: Pt reports intermittent tingling and numbness down Rt lateral leg and thigh   MUSCLE LENGTH: Hamstrings: Right 65 deg; Left 75 deg Limited bil gluteals and piriformis   POSTURE:  mild scoliosis present   PALPATION: Non-tender along lumbar spine and Rt lateral thigh and leg   LUMBAR ROM:    Active  A/PROM  eval  Flexion Fingers to mid shin  Extension WNL  Right lateral flexion WNL  Left lateral flexion WNL  Right rotation WNL  Left rotation WNL   (Blank rows = not tested)   LOWER EXTREMITY ROM:      Passive  Right eval Left eval  Hip flexion 95 95  Hip extension      Hip abduction      Hip adduction      Hip internal rotation      Hip external rotation 50 50  Knee flexion      Knee extension      Ankle dorsiflexion      Ankle plantarflexion      Ankle inversion      Ankle eversion       (Blank rows = not tested)   LOWER EXTREMITY MMT:     MMT Right eval Left eval  Hip flexion 4 4  Hip extension 4 4  Hip abduction 4- 4-  Hip adduction 4 4  Hip internal rotation 4- 4-  Hip external rotation 4- 4-  Knee flexion 4 4  Knee extension 4 4  Ankle dorsiflexion 4+ 4+  Ankle plantarflexion      Ankle inversion      Ankle eversion 4+ 4+   (Blank rows = not tested)   LUMBAR SPECIAL TESTS:  Straight leg raise test: Negative   FUNCTIONAL TESTS:  Functional squat bil knee valgus , able to balance in SLS   GAIT: Distance walked: clinic Assistive device utilized: None Level of assistance: Complete Independence Comments: gait WFL       TODAY'S TREATMENT  12/03/21: NuStep L5 x 6' PT  present to discuss progress Seated trunk flexion and flexion/SB combo with hands on blue physioball x 5 each way Sit to stand feet on blue pad, blue pad in chair x 10 reps with focus on eccentric control and knee valgus control bil Bwd  resistance walking 10lb x 10 reps Green tband standing pallof press series: press out, press up, stir the pot x 10 each, both sides (mild Rt shoulder pain afterwards - Hx of shoulder replacement) Standing deadlift 5lb kbell 2x10 SL hip abd 2x10, TC/VC for stacked pelvis set up and leg alignment, VC to use bottom leg to stabilize into mat table Manual therapy: lumbar STM to lumbar paraspinals and gluteals bil  in prone, moderate pressure  11/28/21: NuStep L5 x 6' PT present to discuss progress Seated blue physioball trunk flexion and flexion/SB bil x 5 each way Supine pelvic tilts, dead bugs x 10 each SL hip abd 2x10 bil Sit to stand red loop band around knees hold 10lb x 15 reps from mat table, mirror for Lt knee control feedback Seated Lt clam red loop x 20 Standing deadlift 5lb to stool x 20 Resisted backward walking 10lb x 10 laps FOTO survey  11/20/21: NuStep L4 x 6' PT present to discuss symptom behavior Supine 2x30" Rt hamstring stretch with strap Supine Rt fig 4 and piriformis hooklying stretch 2x30" Pelvic tilts with TC/VC for coordination with breathing pattern x 20 Supine bridge with yellow loop clam x 15 Dying bug opp arm/leg 1x10 (needed review of sequencing) SL hip abduction 1x10 each side Chair sit up holding bil 2lb dumbbells at chest     PATIENT EDUCATION:  Education details: Access Code: [place] Person educated: Patient Education method: Explanation, Demonstration, and Handouts Education comprehension: verbalized understanding and returned demonstration     HOME EXERCISE PROGRAM: Access Code: [email or web address] URL: [email or web address] Date: 11/28/2021 Prepared by: Venetia Night Annya Lizana  Exercises - Supine Hamstring Stretch   - 1 x daily - 7 x weekly - 1 sets - 2 reps - 30 hold - Supine Sciatic Nerve Glide  - 1 x daily - 7 x weekly - 1 sets - 30 reps - Supine Double Knee to Chest  - 1 x daily - 7 x weekly - 1 sets - 2 reps - 30 hold - Supine Piriformis Stretch with Foot on Ground  - 1 x daily - 7 x weekly - 1 sets - 2 reps - 30 hold - Supine Figure 4 Piriformis Stretch  - 1 x daily - 7 x weekly - 1 sets - 2 reps - 30 hold - Hooklying Clamshell with Resistance  - 1 x daily - 7 x weekly - 2 sets - 20 reps - Clamshell with Resistance  - 1 x daily - 7 x weekly - 1 sets - 10 reps - Supine Dead Bug with Leg Extension  - 1 x daily - 7 x weekly - 2 sets - 10 reps - Sit to Stand with Resistance Around Legs  - 1 x daily - 7 x weekly - 3 sets - 10 reps - Lateral Step Up  - 1 x daily - 7 x weekly - 3 sets - 10 reps - Pilates Bridge  - 1 x daily - 7 x weekly - 3 sets - 10 reps - Sidelying Hip Abduction  - 1 x daily - 7 x weekly - 2 sets - 10 reps - Standing Deadlift with Barbell - Knees Straight  - 1 x daily - 7 x weekly - 2 sets - 15 reps   ASSESSMENT:   CLINICAL IMPRESSION:   Pt reports 25% improvement in symptoms since starting PT.  She manages pain with Celebrex.  She no longer has any severe pain.  She notes lumbar stretching is relieving which suggests more flexion  as positional preference.  She continues to have bil hip weakness and needs TC/VC for SL hip abduction alignment and form.  Added standing pallof series with green band today which did fatigue lumbar region.  Performed STM in prone with good relief end of session.    OBJECTIVE IMPAIRMENTS decreased ROM, decreased strength, impaired flexibility, impaired sensation, improper body mechanics, and pain.    ACTIVITY LIMITATIONS standing, squatting, and locomotion level   PARTICIPATION LIMITATIONS: community activity   PERSONAL FACTORS 1 comorbidity: bil THA  are also affecting patient's functional outcome.    REHAB POTENTIAL: Good   CLINICAL DECISION MAKING:  Stable/uncomplicated   EVALUATION COMPLEXITY: Low     GOALS: Goals reviewed with patient? Yes   SHORT TERM GOALS: Target date: 11/27/2021   Pt will be ind with initial HEP Baseline: Goal status: met   2.  Pt will report at least 20% improvement in Rt LE symptoms with WB tasks and with exercise walks. Baseline:  Goal status: met, 25% improvement  11/28/21   3.  Pt will improve LE flexibility to WNL Baseline:  Goal status: met   4.  Pt will demo functional squat x 10 reps with good knee alignment Baseline: bil knee valgus Goal status: met       LONG TERM GOALS: Target date: 12/18/2021   Pt will be ind with advanced HEP and understand how to safely progress. Baseline:  Goal status: INITIAL   2.  Pt will report at least 70% reduction in Rt LE symptoms with WB tasks and exercise walking. Baseline:  Goal status: ongoing   3.  Improve FOTO score to at least 72% to demo improved function. Baseline: 64%, 57% on 11/28/21 (regressed but Pt reports she is 25% better) Goal status: ongoing   4.  Pt will achieve core and LE strength of at least 4+/5 for improved postural support and functional task performance. Baseline:  Goal status: ongoing     PLAN: PT FREQUENCY: 2x/week   PT DURATION: 6 weeks   PLANNED INTERVENTIONS: Therapeutic exercises, Therapeutic activity, Neuromuscular re-education, Patient/Family education, Joint mobilization, Dry Needling, Electrical stimulation, Spinal mobilization, Cryotherapy, Moist heat, and Manual therapy.   PLAN FOR NEXT SESSION:  Work on dying bug and other lower abdominal engaging exercises.  Lasalle Abee, PT 12/03/21 10:18 AM

## 2021-12-09 ENCOUNTER — Ambulatory Visit: Payer: PPO

## 2021-12-09 DIAGNOSIS — M5416 Radiculopathy, lumbar region: Secondary | ICD-10-CM

## 2021-12-09 DIAGNOSIS — M6281 Muscle weakness (generalized): Secondary | ICD-10-CM

## 2021-12-09 DIAGNOSIS — R262 Difficulty in walking, not elsewhere classified: Secondary | ICD-10-CM

## 2021-12-09 NOTE — Therapy (Signed)
OUTPATIENT PHYSICAL THERAPY TREATMENT NOTE   Patient Name: Kimberly Johns MRN: [id number] DOB:[birth year], 81 y.o., female Today's Date: 12/09/2021  PCP: Maurice Small, MD REFERRING PROVIDER:  Pete Pelt, PA-C   END OF SESSION:   PT End of Session - 12/09/21 1022     Visit Number 7    Date for PT Re-Evaluation 12/18/21    Authorization Type [employer]    PT Start Time 1015    PT Stop Time 1100    PT Time Calculation (min) 45 min    Activity Tolerance Patient tolerated treatment well    Behavior During Therapy WFL for tasks assessed/performed                 Past Medical History:  Diagnosis Date   Anxiety    managed with propanolol    Arthritis    Colon polyps    Diverticulosis    Fracture of ankle, trimalleolar, left, closed    Gallbladder disease    denies    GERD (gastroesophageal reflux disease)    History of chicken pox    HLD (hyperlipidemia)    Hypothyroidism    Kidney cysts    Liver cyst    Measles    hx of    Mumps    hx of    PONV (postoperative nausea and vomiting)    with general anesthesia    Rotator cuff tear    right    Shingles    hx of    Past Surgical History:  Procedure Laterality Date   ANKLE FRACTURE SURGERY Left 2009   ANTERIOR CRUCIATE LIGAMENT REPAIR Left 2001   APPENDECTOMY  2010   COLONOSCOPY WITH PROPOFOL N/A 10/30/2014   Procedure: COLONOSCOPY WITH PROPOFOL;  Surgeon: Garlan Fair, MD;  Location: [facility];  Service: Endoscopy;  Laterality: N/A;   REVERSE SHOULDER ARTHROPLASTY Right 07/16/2018   Procedure: REVERSE SHOULDER ARTHROPLASTY;  Surgeon: Netta Cedars, MD;  Location: [facility];  Service: Orthopedics;  Laterality: Right;   SHOULDER ARTHROSCOPY WITH ROTATOR CUFF REPAIR Right 07/16/2018   Procedure: SHOULDER ARTHROSCOPY;  Surgeon: Netta Cedars, MD;  Location: [facility];  Service: Orthopedics;  Laterality: Right;  IS block   TONSILLECTOMY     age 37   TONSILLECTOMY     TOTAL HIP ARTHROPLASTY Right  12/08/2014   Procedure: RIGHT TOTAL HIP ARTHROPLASTY ANTERIOR APPROACH;  Surgeon: Mcarthur Rossetti, MD;  Location: [facility];  Service: Orthopedics;  Laterality: Right;   TOTAL HIP ARTHROPLASTY Left 06/20/2016   Procedure: LEFT TOTAL HIP ARTHROPLASTY ANTERIOR APPROACH;  Surgeon: Mcarthur Rossetti, MD;  Location: [facility];  Service: Orthopedics;  Laterality: Left;   Patient Active Problem List   Diagnosis Date Noted   S/P shoulder replacement, right 07/16/2018   History of removal of retained hardware 12/17/2016   Unilateral primary osteoarthritis, left hip 06/20/2016   Status post left hip replacement 06/20/2016   Osteoarthritis of right hip 12/08/2014   Status post total replacement of right hip 12/08/2014   Abdominal pain, epigastric 07/21/2012   Esophageal reflux 07/21/2012   Personal history of colonic polyps 07/21/2012    REFERRING DIAG: M79.604 (ICD-10-CM) - Pain in right leg     THERAPY DIAG:  Radiculopathy, lumbar region  Muscle weakness (generalized)  Difficulty in walking, not elsewhere classified  Rationale for Evaluation and Treatment Rehabilitation  PERTINENT HISTORY: Bil hip replacements anterior approach 2016 and 2018  PRECAUTIONS: None  SUBJECTIVE: Patient reports "about the same"  "As long as  I'm taking the Celebrex, I am ok".  Pain worse at night when climbing steps and this is when the Celebrex is wearing off.    PAIN:  3-4/10   OBJECTIVE: (objective measures completed at initial evaluation unless otherwise dated)  OBJECTIVE:    DIAGNOSTIC FINDINGS:  Result Date: 10/31/2021 Lumbar spine 2 views: Shows slight scoliosis.  Degenerative changes throughout the lumbar spine with endplate spurring at H06-C3 with near bridging.  Retrolisthesis L3 on 4 slight. Grade 1 anterior spinal listhesis L4 on L5.  No acute fractures. Result Date: 10/31/2021 AP pelvis: Bilateral hips well located.  No acute fractures.  Bilateral total hip arthroplasty components  well-seated.  No acute findings.    PATIENT SURVEYS:  Eval: FOTO 64% goal 72% 11/28/21: FOTO 56%, regressed but contradictory to Pt subjective report of 25% improved   SCREENING FOR RED FLAGS: Bowel or bladder incontinence: No Spinal tumors: No Cauda equina syndrome: No Compression fracture: No Abdominal aneurysm: No   COGNITION:           Overall cognitive status: Within functional limits for tasks assessed                          SENSATION: Pt reports intermittent tingling and numbness down Rt lateral leg and thigh   MUSCLE LENGTH: Hamstrings: Right 65 deg; Left 75 deg Limited bil gluteals and piriformis   POSTURE:  mild scoliosis present   PALPATION: Non-tender along lumbar spine and Rt lateral thigh and leg   LUMBAR ROM:    Active  A/PROM  eval  Flexion Fingers to mid shin  Extension WNL  Right lateral flexion WNL  Left lateral flexion WNL  Right rotation WNL  Left rotation WNL   (Blank rows = not tested)   LOWER EXTREMITY ROM:      Passive  Right eval Left eval  Hip flexion 95 95  Hip extension      Hip abduction      Hip adduction      Hip internal rotation      Hip external rotation 50 50  Knee flexion      Knee extension      Ankle dorsiflexion      Ankle plantarflexion      Ankle inversion      Ankle eversion       (Blank rows = not tested)   LOWER EXTREMITY MMT:     MMT Right eval Left eval  Hip flexion 4 4  Hip extension 4 4  Hip abduction 4- 4-  Hip adduction 4 4  Hip internal rotation 4- 4-  Hip external rotation 4- 4-  Knee flexion 4 4  Knee extension 4 4  Ankle dorsiflexion 4+ 4+  Ankle plantarflexion      Ankle inversion      Ankle eversion 4+ 4+   (Blank rows = not tested)   LUMBAR SPECIAL TESTS:  Straight leg raise test: Negative   FUNCTIONAL TESTS:  Functional squat bil knee valgus , able to balance in SLS   GAIT: Distance walked: clinic Assistive device utilized: None Level of assistance: Complete  Independence Comments: gait WFL       TODAY'S TREATMENT  12/09/21: NuStep L5 x 5' PT present to discuss progress Lateral band walks with blue loop x 3 laps of 10 steps Hook lying clams with blue loop x 20 Side lying clam with blue loop x 20 Supine pelvic tilt x 20 Supine pelvic  tilt with dying bug x 20 IT band and Hamstring stretching 3 x 30 sec (lengthy education on distinguishing the two and how to isolate) Educated patient on nerve root impingement vs "sciatica" and possible IT band and hamstring tightness adding to her leg pain due to her hip surgeries.   12/03/21: NuStep L5 x 6' PT present to discuss progress Seated trunk flexion and flexion/SB combo with hands on blue physioball x 5 each way Sit to stand feet on blue pad, blue pad in chair x 10 reps with focus on eccentric control and knee valgus control bil Bwd resistance walking 10lb x 10 reps Green tband standing pallof press series: press out, press up, stir the pot x 10 each, both sides (mild Rt shoulder pain afterwards - Hx of shoulder replacement) Standing deadlift 5lb kbell 2x10 SL hip abd 2x10, TC/VC for stacked pelvis set up and leg alignment, VC to use bottom leg to stabilize into mat table Manual therapy: lumbar STM to lumbar paraspinals and gluteals bil  in prone, moderate pressure  11/28/21: NuStep L5 x 6' PT present to discuss progress Seated blue physioball trunk flexion and flexion/SB bil x 5 each way Supine pelvic tilts, dead bugs x 10 each SL hip abd 2x10 bil Sit to stand red loop band around knees hold 10lb x 15 reps from mat table, mirror for Lt knee control feedback Seated Lt clam red loop x 20 Standing deadlift 5lb to stool x 20 Resisted backward walking 10lb x 10 laps FOTO survey      PATIENT EDUCATION:  Education details: Access Code: [email or web address] Person educated: Patient Education method: Consulting civil engineer, Media planner, and Handouts Education comprehension: verbalized understanding and returned  demonstration     HOME EXERCISE PROGRAM: Access Code: [email or web address] URL: [email or web address] Date: 12/09/2021 Prepared by: Candyce Churn  Exercises - Supine Hamstring Stretch  - 1 x daily - 7 x weekly - 1 sets - 2 reps - 30 hold - Supine Sciatic Nerve Glide  - 1 x daily - 7 x weekly - 1 sets - 30 reps - Supine Double Knee to Chest  - 1 x daily - 7 x weekly - 1 sets - 2 reps - 30 hold - Supine Piriformis Stretch with Foot on Ground  - 1 x daily - 7 x weekly - 1 sets - 2 reps - 30 hold - Supine Figure 4 Piriformis Stretch  - 1 x daily - 7 x weekly - 1 sets - 2 reps - 30 hold - Hooklying Clamshell with Resistance  - 1 x daily - 7 x weekly - 2 sets - 20 reps - Clamshell with Resistance  - 1 x daily - 7 x weekly - 1 sets - 10 reps - Supine Dead Bug with Leg Extension  - 1 x daily - 7 x weekly - 2 sets - 10 reps - Sit to Stand with Resistance Around Legs  - 1 x daily - 7 x weekly - 3 sets - 10 reps - Lateral Step Up  - 1 x daily - 7 x weekly - 3 sets - 10 reps - Pilates Bridge  - 1 x daily - 7 x weekly - 3 sets - 10 reps - Sidelying Hip Abduction  - 1 x daily - 7 x weekly - 2 sets - 10 reps - Standing Deadlift with Barbell - Knees Straight  - 1 x daily - 7 x weekly - 2 sets - 15 reps - Supine ITB Stretch with Strap  - 1 x daily -  7 x weekly - 1 sets - 3 reps - 30 sec hold - Supine Dead Bug with Leg Extension  - 1 x daily - 7 x weekly - 2 sets - 10 reps - Side Stepping with Resistance at Ankles  - 1 x daily - 7 x weekly - 3 sets - 10 reps  ASSESSMENT:   CLINICAL IMPRESSION:  Porschea is reporting "about the same but definitely better than when I started".  We added core strengthening today which she was able to do without any increased pain.  She did fatigue easily and needed rest breaks at every 10 reps on unsupported dying bug.  She is extremely tight bilateral hamstrings and IT band as well due to her valgus knee position.  We added hamstring and IT band stretching which she was  able to do without any issues with her hip replacements.  She is well motivated and compliant.  She would benefit from continued skilled PT for LE stretching, core stabilization and focused hip strengthening to improve alignment.    OBJECTIVE IMPAIRMENTS decreased ROM, decreased strength, impaired flexibility, impaired sensation, improper body mechanics, and pain.    ACTIVITY LIMITATIONS standing, squatting, and locomotion level   PARTICIPATION LIMITATIONS: community activity   PERSONAL FACTORS 1 comorbidity: bil THA  are also affecting patient's functional outcome.    REHAB POTENTIAL: Good   CLINICAL DECISION MAKING: Stable/uncomplicated   EVALUATION COMPLEXITY: Low     GOALS: Goals reviewed with patient? Yes   SHORT TERM GOALS: Target date: 11/27/2021   Pt will be ind with initial HEP Baseline: Goal status: met   2.  Pt will report at least 20% improvement in Rt LE symptoms with WB tasks and with exercise walks. Baseline:  Goal status: met, 25% improvement  11/28/21   3.  Pt will improve LE flexibility to WNL Baseline:  Goal status: met   4.  Pt will demo functional squat x 10 reps with good knee alignment Baseline: bil knee valgus Goal status: met       LONG TERM GOALS: Target date: 12/18/2021   Pt will be ind with advanced HEP and understand how to safely progress. Baseline:  Goal status: INITIAL   2.  Pt will report at least 70% reduction in Rt LE symptoms with WB tasks and exercise walking. Baseline:  Goal status: ongoing   3.  Improve FOTO score to at least 72% to demo improved function. Baseline: 64%, 57% on 11/28/21 (regressed but Pt reports she is 25% better) Goal status: ongoing   4.  Pt will achieve core and LE strength of at least 4+/5 for improved postural support and functional task performance. Baseline:  Goal status: ongoing     PLAN: PT FREQUENCY: 2x/week   PT DURATION: 6 weeks   PLANNED INTERVENTIONS: Therapeutic exercises, Therapeutic  activity, Neuromuscular re-education, Patient/Family education, Joint mobilization, Dry Needling, Electrical stimulation, Spinal mobilization, Cryotherapy, Moist heat, and Manual therapy.   PLAN FOR NEXT SESSION:  Progress lower abdominal engaging exercises and LE flexibility exercises.   Anderson Malta B. Mao Lockner, PT 12/09/21 11:17 AM  [facility] [address], [place], [place] [address] Phone # [phone] Fax [phone]

## 2021-12-11 ENCOUNTER — Ambulatory Visit (INDEPENDENT_AMBULATORY_CARE_PROVIDER_SITE_OTHER): Payer: PPO | Admitting: Physician Assistant

## 2021-12-11 ENCOUNTER — Encounter: Payer: Self-pay | Admitting: Physician Assistant

## 2021-12-11 DIAGNOSIS — M5416 Radiculopathy, lumbar region: Secondary | ICD-10-CM | POA: Diagnosis not present

## 2021-12-11 NOTE — Progress Notes (Signed)
HPI: Mrs. Repetto returns today for follow-up of her low back pain and radicular symptoms down the right leg.  She states with therapy she is some 50% better.  Feels the Celebrex also helps that time she has no pain but once the Celebrex wears off her pain does return.  She states her pain now is 5 out of 10 pain at worst.  It ran down the lateral aspect of her right leg to the ankle.  She states the numbness and tingling is gone however.  She denies any waking pain fevers or chills.  She has had prior epidural steroid injections in the past that were beneficial.  Her lumbar MRI back in 2015 that showed showed a small right foraminal disc protrusion at L4-5 which was contacting the right L4 nerve root.  Review of systems: See HPI otherwise negative.  Physical exam: General well-developed well-nourished female who ambulates without any assistive device. Psych: Alert and oriented x 3 Lower extremities: Negative straight leg raise bilaterally.  Tight hamstrings bilaterally.  She comes within 3 inches of touching her toes and has no pain with flexion or extension of the lumbar spine.  Impression: Lumbar radiculopathy  Plan: Recommend MRI lumbar spine to rule out HNP as the source of her radicular symptoms down the right leg.  Have her back once studies are available.  Questions were encouraged and answered at length.  She will continue to do her exercises as taught by physical therapy.  She will also continue her Celebrex.  Questions were encouraged and answered

## 2021-12-12 NOTE — Addendum Note (Signed)
Addended by: Clovis Mankins L on: 12/12/2021 08:15 AM   Modules accepted: Orders  

## 2021-12-16 ENCOUNTER — Ambulatory Visit
Admission: RE | Admit: 2021-12-16 | Discharge: 2021-12-16 | Disposition: A | Discharge status: Home / Self Care | Payer: PPO | Source: Ambulatory Visit | Attending: Physician Assistant | Admitting: Physician Assistant

## 2021-12-16 DIAGNOSIS — M48061 Spinal stenosis, lumbar region without neurogenic claudication: Secondary | ICD-10-CM | POA: Diagnosis not present

## 2021-12-16 DIAGNOSIS — M545 Low back pain, unspecified: Secondary | ICD-10-CM | POA: Diagnosis not present

## 2021-12-16 DIAGNOSIS — M5416 Radiculopathy, lumbar region: Secondary | ICD-10-CM

## 2021-12-17 ENCOUNTER — Encounter: Payer: Self-pay | Admitting: Physical Therapy

## 2021-12-17 ENCOUNTER — Ambulatory Visit: Payer: PPO | Admitting: Physical Therapy

## 2021-12-17 DIAGNOSIS — M5416 Radiculopathy, lumbar region: Secondary | ICD-10-CM | POA: Diagnosis not present

## 2021-12-17 DIAGNOSIS — M6281 Muscle weakness (generalized): Secondary | ICD-10-CM

## 2021-12-17 DIAGNOSIS — R262 Difficulty in walking, not elsewhere classified: Secondary | ICD-10-CM

## 2021-12-17 NOTE — Therapy (Signed)
OUTPATIENT PHYSICAL THERAPY TREATMENT NOTE   Patient Name: Kimberly Johns MRN: [id number] DOB:05/31/1940, 81 y.o., female Today's Date: 12/17/2021  PCP: Maurice Small, MD REFERRING PROVIDER:  Pete Pelt, PA-C   END OF SESSION:   PT End of Session - 12/17/21 1153     Visit Number 8    Date for PT Re-Evaluation 12/18/21    Authorization Type [employer]    Progress Note Due on Visit 10    PT Start Time 1147    PT Stop Time 1230    PT Time Calculation (min) 43 min    Activity Tolerance Patient tolerated treatment well    Behavior During Therapy WFL for tasks assessed/performed                  Past Medical History:  Diagnosis Date   Anxiety    managed with propanolol    Arthritis    Colon polyps    Diverticulosis    Fracture of ankle, trimalleolar, left, closed    Gallbladder disease    denies    GERD (gastroesophageal reflux disease)    History of chicken pox    HLD (hyperlipidemia)    Hypothyroidism    Kidney cysts    Liver cyst    Measles    hx of    Mumps    hx of    PONV (postoperative nausea and vomiting)    with general anesthesia    Rotator cuff tear    right    Shingles    hx of    Past Surgical History:  Procedure Laterality Date   ANKLE FRACTURE SURGERY Left 2009   ANTERIOR CRUCIATE LIGAMENT REPAIR Left 2001   APPENDECTOMY  2010   COLONOSCOPY WITH PROPOFOL N/A 10/30/2014   Procedure: COLONOSCOPY WITH PROPOFOL;  Surgeon: Garlan Fair, MD;  Location: [facility];  Service: Endoscopy;  Laterality: N/A;   REVERSE SHOULDER ARTHROPLASTY Right 07/16/2018   Procedure: REVERSE SHOULDER ARTHROPLASTY;  Surgeon: Netta Cedars, MD;  Location: [facility];  Service: Orthopedics;  Laterality: Right;   SHOULDER ARTHROSCOPY WITH ROTATOR CUFF REPAIR Right 07/16/2018   Procedure: SHOULDER ARTHROSCOPY;  Surgeon: Netta Cedars, MD;  Location: [facility];  Service: Orthopedics;  Laterality: Right;  IS block   TONSILLECTOMY     age 46   TONSILLECTOMY      TOTAL HIP ARTHROPLASTY Right 12/08/2014   Procedure: RIGHT TOTAL HIP ARTHROPLASTY ANTERIOR APPROACH;  Surgeon: Mcarthur Rossetti, MD;  Location: [facility];  Service: Orthopedics;  Laterality: Right;   TOTAL HIP ARTHROPLASTY Left 06/20/2016   Procedure: LEFT TOTAL HIP ARTHROPLASTY ANTERIOR APPROACH;  Surgeon: Mcarthur Rossetti, MD;  Location: [facility];  Service: Orthopedics;  Laterality: Left;   Patient Active Problem List   Diagnosis Date Noted   S/P shoulder replacement, right 07/16/2018   History of removal of retained hardware 12/17/2016   Unilateral primary osteoarthritis, left hip 06/20/2016   Status post left hip replacement 06/20/2016   Osteoarthritis of right hip 12/08/2014   Status post total replacement of right hip 12/08/2014   Abdominal pain, epigastric 07/21/2012   Esophageal reflux 07/21/2012   Personal history of colonic polyps 07/21/2012    REFERRING DIAG: M79.604 (ICD-10-CM) - Pain in right leg     THERAPY DIAG:  Radiculopathy, lumbar region  Muscle weakness (generalized)  Difficulty in walking, not elsewhere classified  Rationale for Evaluation and Treatment Rehabilitation  PERTINENT HISTORY: Bil hip replacements anterior approach 2016 and 2018  PRECAUTIONS: None  SUBJECTIVE: I am more flexible.  I am walking better - I have a longer stride length now.  Pain isn't as high and severe but it does come on without Celebrex and is worse at end of day. Overall I'm 30-35% better.  PAIN:  3-4/10   OBJECTIVE: (objective measures completed at initial evaluation unless otherwise dated)  OBJECTIVE:    DIAGNOSTIC FINDINGS:  Result Date: 10/31/2021 Lumbar spine 2 views: Shows slight scoliosis.  Degenerative changes throughout the lumbar spine with endplate spurring at N47-S9 with near bridging.  Retrolisthesis L3 on 4 slight. Grade 1 anterior spinal listhesis L4 on L5.  No acute fractures. Result Date: 10/31/2021 AP pelvis: Bilateral hips well located.  No acute  fractures.  Bilateral total hip arthroplasty components well-seated.  No acute findings.    PATIENT SURVEYS:  Eval: FOTO 64% goal 72% 11/28/21: FOTO 56%, regressed but contradictory to Pt subjective report of 25% improved 12/17/21: FOTO 63%   SCREENING FOR RED FLAGS: Bowel or bladder incontinence: No Spinal tumors: No Cauda equina syndrome: No Compression fracture: No Abdominal aneurysm: No   COGNITION:           Overall cognitive status: Within functional limits for tasks assessed                          SENSATION: Pt reports intermittent tingling and numbness down Rt lateral leg and thigh   MUSCLE LENGTH: Hamstrings: Right 65 deg; Left 75 deg Limited bil gluteals and piriformis   POSTURE:  mild scoliosis present   PALPATION: Non-tender along lumbar spine and Rt lateral thigh and leg   LUMBAR ROM:    Active  A/PROM  eval  Flexion Fingers to mid shin  Extension WNL  Right lateral flexion WNL  Left lateral flexion WNL  Right rotation WNL  Left rotation WNL   (Blank rows = not tested)   LOWER EXTREMITY ROM:      Passive  Right eval Left eval Right 8/15 Left 8/15  Hip flexion 95 95    Hip extension        Hip abduction        Hip adduction        Hip internal rotation        Hip external rotation 50 50    Knee flexion        Knee extension        Ankle dorsiflexion        Ankle plantarflexion        Ankle inversion        Ankle eversion         (Blank rows = not tested)   LOWER EXTREMITY MMT:     MMT Right eval Left eval Right 8/15 Left  8/15  Hip flexion 4 4 4+ 4+  Hip extension _0 Hip abduction 4- 4- 5 5  Hip adduction _1 Hip internal rotation 4- 4- 5 5  Hip external rotation 4- 4- 5 5  Knee flexion _2 Knee extension _3 Ankle dorsiflexion 4+ 4+ 5 5  Ankle plantarflexion        Ankle inversion        Ankle eversion 4+ 4+ 5 5   (Blank rows = not tested)   LUMBAR SPECIAL TESTS:  Straight leg raise test: Negative    FUNCTIONAL TESTS:  Functional squat bil knee valgus ,  able to balance in SLS   GAIT: Distance walked: clinic Assistive device utilized: None Level of assistance: Complete Independence Comments: gait WFL       TODAY'S TREATMENT  8/15: MMT and FOTO (see above) Seated hip flexion with tied green band loop around thighs 2x20", VC for level shoulders, ribcage and pelvis for stabilization (added to HEP) NuStep L4 x 6' PT present to review goals Reviewed dead bug and progressed basic bridge to fig 4 bridge and single leg bridge LE kick out ITB stretch bil (review)  12/09/21: NuStep L5 x 5' PT present to discuss progress Lateral band walks with blue loop x 3 laps of 10 steps Hook lying clams with blue loop x 20 Side lying clam with blue loop x 20 Supine pelvic tilt x 20 Supine pelvic tilt with dying bug x 20 IT band and Hamstring stretching 3 x 30 sec (lengthy education on distinguishing the two and how to isolate) Educated patient on nerve root impingement vs "sciatica" and possible IT band and hamstring tightness adding to her leg pain due to her hip surgeries.   12/03/21: NuStep L5 x 6' PT present to discuss progress Seated trunk flexion and flexion/SB combo with hands on blue physioball x 5 each way Sit to stand feet on blue pad, blue pad in chair x 10 reps with focus on eccentric control and knee valgus control bil Bwd resistance walking 10lb x 10 reps Green tband standing pallof press series: press out, press up, stir the pot x 10 each, both sides (mild Rt shoulder pain afterwards - Hx of shoulder replacement) Standing deadlift 5lb kbell 2x10 SL hip abd 2x10, TC/VC for stacked pelvis set up and leg alignment, VC to use bottom leg to stabilize into mat table Manual therapy: lumbar STM to lumbar paraspinals and gluteals bil  in prone, moderate pressure    PATIENT EDUCATION:  Education details: Access Code: [email or web address] Person educated: Patient Education method: Explanation,  Demonstration, and Handouts Education comprehension: verbalized understanding and returned demonstration     HOME EXERCISE PROGRAM: Access Code: [email or web address] URL: [email or web address] Date: 12/09/2021 Prepared by: Candyce Churn  Exercises - Supine Hamstring Stretch  - 1 x daily - 7 x weekly - 1 sets - 2 reps - 30 hold - Supine Sciatic Nerve Glide  - 1 x daily - 7 x weekly - 1 sets - 30 reps - Supine Double Knee to Chest  - 1 x daily - 7 x weekly - 1 sets - 2 reps - 30 hold - Supine Piriformis Stretch with Foot on Ground  - 1 x daily - 7 x weekly - 1 sets - 2 reps - 30 hold - Supine Figure 4 Piriformis Stretch  - 1 x daily - 7 x weekly - 1 sets - 2 reps - 30 hold - Hooklying Clamshell with Resistance  - 1 x daily - 7 x weekly - 2 sets - 20 reps - Clamshell with Resistance  - 1 x daily - 7 x weekly - 1 sets - 10 reps - Supine Dead Bug with Leg Extension  - 1 x daily - 7 x weekly - 2 sets - 10 reps - Sit to Stand with Resistance Around Legs  - 1 x daily - 7 x weekly - 3 sets - 10 reps - Lateral Step Up  - 1 x daily - 7 x weekly - 3 sets - 10 reps - Pilates Bridge  - 1 x daily - 7 x weekly - 3 sets -  10 reps - Sidelying Hip Abduction  - 1 x daily - 7 x weekly - 2 sets - 10 reps - Standing Deadlift with Barbell - Knees Straight  - 1 x daily - 7 x weekly - 2 sets - 15 reps - Supine ITB Stretch with Strap  - 1 x daily - 7 x weekly - 1 sets - 3 reps - 30 sec hold - Supine Dead Bug with Leg Extension  - 1 x daily - 7 x weekly - 2 sets - 10 reps - Side Stepping with Resistance at Ankles  - 1 x daily - 7 x weekly - 3 sets - 10 reps  ASSESSMENT:   CLINICAL IMPRESSION:  Pt reports readiness for d/c.  She reports improved feeling of flexibility, stride length, balance when dressing, and strength.  She is compliant with HEP.  LE strength has improved to 5/5 with exception of hip flexors for which PT added seated resisted march with green tband today.  Pt reports 35% improvement in pain  since starting PT and is pleased with her progress.  D/C with return for more PT as needed for any set backs.   OBJECTIVE IMPAIRMENTS decreased ROM, decreased strength, impaired flexibility, impaired sensation, improper body mechanics, and pain.    ACTIVITY LIMITATIONS standing, squatting, and locomotion level   PARTICIPATION LIMITATIONS: community activity   PERSONAL FACTORS 1 comorbidity: bil THA  are also affecting patient's functional outcome.    REHAB POTENTIAL: Good   CLINICAL DECISION MAKING: Stable/uncomplicated   EVALUATION COMPLEXITY: Low     GOALS: Goals reviewed with patient? Yes   SHORT TERM GOALS: Target date: 11/27/2021   Pt will be ind with initial HEP Baseline: Goal status: met   2.  Pt will report at least 20% improvement in Rt LE symptoms with WB tasks and with exercise walks. Baseline:  Goal status: met, 25% improvement  11/28/21   3.  Pt will improve LE flexibility to WNL Baseline:  Goal status: met   4.  Pt will demo functional squat x 10 reps with good knee alignment Baseline: bil knee valgus Goal status: met       LONG TERM GOALS: Target date: 12/18/2021   Pt will be ind with advanced HEP and understand how to safely progress. Baseline:  Goal status: met   2.  Pt will report at least 70% reduction in Rt LE symptoms with WB tasks and exercise walking. Baseline: 30-35% Goal status:partially met   3.  Improve FOTO score to at least 72% to demo improved function. Baseline: 64%, 57% on 11/28/21 (regressed but Pt reports she is 25% better) Goal status: not met   4.  Pt will achieve core and LE strength of at least 4+/5 for improved postural support and functional task performance. Baseline:  Goal status: met     PLAN: PT FREQUENCY: 2x/week   PT DURATION: 6 weeks   PLANNED INTERVENTIONS: Therapeutic exercises, Therapeutic activity, Neuromuscular re-education, Patient/Family education, Joint mobilization, Dry Needling, Electrical  stimulation, Spinal mobilization, Cryotherapy, Moist heat, and Manual therapy.   PLAN FOR NEXT SESSION:  Progress lower abdominal engaging exercises and LE flexibility exercises.   PHYSICAL THERAPY DISCHARGE SUMMARY  Visits from Start of Care: 8  Current functional level related to goals / functional outcomes: Partially met   Remaining deficits: Hip flexor strength   Education / Equipment: HEP   Patient agrees to discharge. Patient goals were partially met. Patient is being discharged due to maximized rehab potential.   Venetia Night  Valerye Kobus, PT 12/17/21 12:36 PM   [facility] [address] [place], [place] [address] Phone # [phone] Fax [phone]

## 2021-12-23 ENCOUNTER — Encounter: Payer: Self-pay | Admitting: Physician Assistant

## 2021-12-23 ENCOUNTER — Ambulatory Visit (INDEPENDENT_AMBULATORY_CARE_PROVIDER_SITE_OTHER): Payer: PPO | Admitting: Physician Assistant

## 2021-12-23 DIAGNOSIS — M5416 Radiculopathy, lumbar region: Secondary | ICD-10-CM | POA: Diagnosis not present

## 2021-12-23 NOTE — Addendum Note (Signed)
Addended by: Barbette Or on: 12/23/2021 04:50 PM   Modules accepted: Orders

## 2021-12-23 NOTE — Progress Notes (Signed)
HPI: Mrs. Kimberly Johns returns today to go over the MRI of her lumbar spine.  She states PT has helped.  She states her pain is worse at night particularly in the evening with weightbearing.  Also first thing in the morning whenever she gets out of bed.  She feels that Celebrex greatly helps with the pain.  Again she has low back pain with radicular symptoms down the right leg. MRI of the lumbar spine is reviewed with her actual images reviewed along with lumbar spine model for demonstration of anatomy.  MRI dated 12/17/2021 showed an acute L5 L4 anterior spinal listhesis grade 1.  Also at L4 4 5 she has mild to moderate canal stenosis and moderate right foraminal stenosis.  But at this level she has no canal or foraminal stenosis.  At L5-S1 she has mild left foraminal stenosis.  Impression: Low back pain with radicular symptoms right leg  Plan: Given her continued pain in the evenings and first thing in the morning recommend she continue her exercises as shown by therapy.  Continue her Celebrex.  We will also send her for an epidural steroid injection with Dr. Alvester Morin.  She will follow-up with [place] approximately 2 weeks after the injection to see if what type of response she had.  Questions were encouraged and answered.

## 2022-01-09 ENCOUNTER — Encounter: Payer: PPO | Admitting: Physical Medicine and Rehabilitation

## 2022-01-13 ENCOUNTER — Ambulatory Visit (INDEPENDENT_AMBULATORY_CARE_PROVIDER_SITE_OTHER): Payer: PPO | Admitting: Physical Medicine and Rehabilitation

## 2022-01-13 ENCOUNTER — Ambulatory Visit: Payer: PPO

## 2022-01-13 ENCOUNTER — Encounter: Payer: Self-pay | Admitting: Physical Medicine and Rehabilitation

## 2022-01-13 VITALS — BP 120/71 | HR 62

## 2022-01-13 DIAGNOSIS — M5416 Radiculopathy, lumbar region: Secondary | ICD-10-CM | POA: Diagnosis not present

## 2022-01-13 MED ORDER — METHYLPREDNISOLONE ACETATE 80 MG/ML IJ SUSP
40.0000 mg | Freq: Once | INTRAMUSCULAR | Status: AC
  Administered 2022-01-13: 40 mg

## 2022-01-13 NOTE — Patient Instructions (Signed)

## 2022-01-13 NOTE — Progress Notes (Signed)
Lateral side right leg pain. Burning and tingling. Pain with stair climbing. Takes Celebrex. Says PT is helping.   Numeric Pain Rating Scale and Functional Assessment Average Pain 5   In the last MONTH (on 0-10 scale) has pain interfered with the following?  1. General activity like being  able to carry out your everyday physical activities such as walking, climbing stairs, carrying groceries, or moving a chair?  Rating(5)   +Driver, -BT, -Dye Allergies.

## 2022-01-15 DIAGNOSIS — Z0000 Encounter for general adult medical examination without abnormal findings: Secondary | ICD-10-CM | POA: Diagnosis not present

## 2022-01-15 DIAGNOSIS — M1990 Unspecified osteoarthritis, unspecified site: Secondary | ICD-10-CM | POA: Diagnosis not present

## 2022-01-15 DIAGNOSIS — N1831 Chronic kidney disease, stage 3a: Secondary | ICD-10-CM | POA: Diagnosis not present

## 2022-01-15 DIAGNOSIS — E785 Hyperlipidemia, unspecified: Secondary | ICD-10-CM | POA: Diagnosis not present

## 2022-01-15 DIAGNOSIS — E559 Vitamin D deficiency, unspecified: Secondary | ICD-10-CM | POA: Diagnosis not present

## 2022-01-15 DIAGNOSIS — I10 Essential (primary) hypertension: Secondary | ICD-10-CM | POA: Diagnosis not present

## 2022-01-15 DIAGNOSIS — E2839 Other primary ovarian failure: Secondary | ICD-10-CM | POA: Diagnosis not present

## 2022-01-15 DIAGNOSIS — E039 Hypothyroidism, unspecified: Secondary | ICD-10-CM | POA: Diagnosis not present

## 2022-01-28 ENCOUNTER — Other Ambulatory Visit: Payer: Self-pay | Admitting: Family Medicine

## 2022-01-28 ENCOUNTER — Ambulatory Visit (INDEPENDENT_AMBULATORY_CARE_PROVIDER_SITE_OTHER): Payer: PPO | Admitting: Orthopaedic Surgery

## 2022-01-28 ENCOUNTER — Encounter: Payer: Self-pay | Admitting: Orthopaedic Surgery

## 2022-01-28 DIAGNOSIS — M5416 Radiculopathy, lumbar region: Secondary | ICD-10-CM

## 2022-01-28 DIAGNOSIS — E2839 Other primary ovarian failure: Secondary | ICD-10-CM

## 2022-01-28 NOTE — Progress Notes (Signed)
Kimberly Johns - 81 y.o. female MRN [id number]  Date of birth: February 18, 1941  Office Visit Note: Visit Date: 01/13/2022 PCP: Shirlean Mylar, MD Referred by: Shirlean Mylar, MD  Subjective: Chief Complaint  Patient presents with   Lower Back - Pain   HPI:  Kimberly Johns is a 81 y.o. female who comes in today at the request of Kimberly Edison, PA-C for planned Right L5-S1 Lumbar Interlaminar epidural steroid injection with fluoroscopic guidance.  The patient has failed conservative care including home exercise, medications, time and activity modification.  This injection will be diagnostic and hopefully therapeutic.  Please see requesting physician notes for further details and justification. MRI reviewed with images and spine model.  MRI reviewed in the note below.    ROS Otherwise per HPI.  Assessment & Plan: Visit Diagnoses:    ICD-10-CM   1. Lumbar radiculopathy  M54.16 XR C-ARM NO REPORT    Epidural Steroid injection    methylPREDNISolone acetate (DEPO-MEDROL) injection 40 mg      Plan: No additional findings.   Meds & Orders:  Meds ordered this encounter  Medications   methylPREDNISolone acetate (DEPO-MEDROL) injection 40 mg    Orders Placed This Encounter  Procedures   XR C-ARM NO REPORT   Epidural Steroid injection    Follow-up: Return for visit to requesting provider as needed.   Procedures: No procedures performed  Lumbar Epidural Steroid Injection - Interlaminar Approach with Fluoroscopic Guidance  Patient: Kimberly Johns      Date of Birth: 27-Nov-1940 MRN: [id number] PCP: Shirlean Mylar, MD      Visit Date: 01/13/2022   Universal Protocol:     Consent Given By: the patient  Position: PRONE  Additional Comments: Vital signs were monitored before and after the procedure. Patient was prepped and draped in the usual sterile fashion. The correct patient, procedure, and site was verified.   Injection Procedure Details:   Procedure diagnoses: Lumbar radiculopathy  [M54.16]   Meds Administered:  Meds ordered this encounter  Medications   methylPREDNISolone acetate (DEPO-MEDROL) injection 40 mg     Laterality: Right  Location/Site:  L5-S1  Needle: 3.5 in., 20 ga. Tuohy  Needle Placement: Paramedian epidural  Findings:   -Comments: Excellent flow of contrast into the epidural space.  Procedure Details: Using a paramedian approach from the side mentioned above, the region overlying the inferior lamina was localized under fluoroscopic visualization and the soft tissues overlying this structure were infiltrated with 4 ml. of 1% Lidocaine without Epinephrine. The Tuohy needle was inserted into the epidural space using a paramedian approach.   The epidural space was localized using loss of resistance along with counter oblique bi-planar fluoroscopic views.  After negative aspirate for air, blood, and CSF, a 2 ml. volume of Isovue-250 was injected into the epidural space and the flow of contrast was observed. Radiographs were obtained for documentation purposes.    The injectate was administered into the level noted above.   Additional Comments:  The patient tolerated the procedure well Dressing: 2 x 2 sterile gauze and Band-Aid    Post-procedure details: Patient was observed during the procedure. Post-procedure instructions were reviewed.  Patient left the clinic in stable condition.   Clinical History: MRI LUMBAR SPINE WITHOUT CONTRAST   TECHNIQUE: Multiplanar, multisequence MR imaging of the lumbar spine was performed. No intravenous contrast was administered.   COMPARISON:  2015   FINDINGS: Segmentation:  Standard.   Alignment: Mild sigmoid curvature. New grade 1 anterolisthesis at L4-L5.  Similar mild retrolisthesis at L2-L3 and L3-L4.   Vertebrae: Degenerative endplate irregularity. There is mild associated marrow edema at T12-L1. No suspicious osseous lesion.   Conus medullaris and cauda equina: Conus extends to the  T12-L1 level. Conus and cauda equina appear normal.   Paraspinal and other soft tissues: Unremarkable.   Disc levels:   T12-L1: Disc bulge with endplate osteophytic ridging slightly eccentric to the left. No canal or foraminal stenosis.   L1-L2: Disc bulge.  No canal or foraminal stenosis.   L2-L3: Disc bulge with endplate osteophytic ridging slightly eccentric to the right. No canal or foraminal stenosis.   L3-L4: Disc bulge with endplate osteophytic ridging slightly eccentric to the left. No canal or foraminal stenosis.   L4-L5: New anterolisthesis with disc uncovering and superimposed right foraminal protrusion. New facet arthropathy with ligamentum flavum infolding. Mild to moderate canal stenosis. Narrowing of the right greater than left subarticular recesses. Mild to moderate right foraminal stenosis. No left foraminal stenosis.   L5-S1: Disc bulge with endplate osteophytic ridging eccentric to the left. No canal or right foraminal stenosis. Mild left foraminal stenosis.   IMPRESSION: Multilevel degenerative changes as detailed above. Progression since 2015 particularly at L4-L5 where there is facet mediated anterolisthesis. Narrowing of the right greater than left subarticular recesses and right foraminal narrowing at this level.     Electronically Signed   By: Macy Mis M.D.   On: 12/17/2021 09:23     Objective:  VS:  HT:    WT:   BMI:     BP:120/71  HR:62bpm  TEMP: ( )  RESP:  Physical Exam Vitals and nursing note reviewed.  Constitutional:      General: She is not in acute distress.    Appearance: Normal appearance. She is not ill-appearing.  HENT:     Head: Normocephalic and atraumatic.     Right Ear: External ear normal.     Left Ear: External ear normal.  Eyes:     Extraocular Movements: Extraocular movements intact.  Cardiovascular:     Rate and Rhythm: Normal rate.     Pulses: Normal pulses.  Pulmonary:     Effort: Pulmonary effort is  normal. No respiratory distress.  Abdominal:     General: There is no distension.     Palpations: Abdomen is soft.  Musculoskeletal:        General: Tenderness present.     Cervical back: Neck supple.     Right lower leg: No edema.     Left lower leg: No edema.     Comments: Patient has good distal strength with no pain over the greater trochanters.  No clonus or focal weakness.  Skin:    Findings: No erythema, lesion or rash.  Neurological:     General: No focal deficit present.     Mental Status: She is alert and oriented to person, place, and time.     Sensory: No sensory deficit.     Motor: No weakness or abnormal muscle tone.     Coordination: Coordination normal.  Psychiatric:        Mood and Affect: Mood normal.        Behavior: Behavior normal.      Imaging: No results found.

## 2022-01-28 NOTE — Progress Notes (Signed)
HPI: Mrs. Brubacher comes in today for follow-up of her right radicular lumbar pain.  She states that she is not having symptoms down the right leg.  She has never had related back pain.  She continues to do her exercises as taught by therapy and she goes to the stretches.  She underwent an L5-S1 paramedian injection by Dr. Ernestina Patches on 01/13/2022 and feels that this was helpful.  Review of systems: See HPI otherwise negative  Physical exam: General well-developed well-nourished female no acute distress ambulates without any assistive device. Lower extremities: Negative straight leg raise bilaterally.  She is able to come within an inch of being able to touch her toes with forward flexion of the lumbar spine.  Impression: Lumbar radicular pain resolved  Plan: She will continue her exercises, therapy.  She will follow-up with [place] as needed.  Questions were encouraged and answered at length.

## 2022-01-28 NOTE — Procedures (Signed)
Lumbar Epidural Steroid Injection - Interlaminar Approach with Fluoroscopic Guidance  Patient: Kimberly Johns      Date of Birth: June 08, 1940 MRN: [id number] PCP: Maurice Small, MD      Visit Date: 01/13/2022   Universal Protocol:     Consent Given By: the patient  Position: PRONE  Additional Comments: Vital signs were monitored before and after the procedure. Patient was prepped and draped in the usual sterile fashion. The correct patient, procedure, and site was verified.   Injection Procedure Details:   Procedure diagnoses: Lumbar radiculopathy [M54.16]   Meds Administered:  Meds ordered this encounter  Medications   methylPREDNISolone acetate (DEPO-MEDROL) injection 40 mg     Laterality: Right  Location/Site:  L5-S1  Needle: 3.5 in., 20 ga. Tuohy  Needle Placement: Paramedian epidural  Findings:   -Comments: Excellent flow of contrast into the epidural space.  Procedure Details: Using a paramedian approach from the side mentioned above, the region overlying the inferior lamina was localized under fluoroscopic visualization and the soft tissues overlying this structure were infiltrated with 4 ml. of 1% Lidocaine without Epinephrine. The Tuohy needle was inserted into the epidural space using a paramedian approach.   The epidural space was localized using loss of resistance along with counter oblique bi-planar fluoroscopic views.  After negative aspirate for air, blood, and CSF, a 2 ml. volume of Isovue-250 was injected into the epidural space and the flow of contrast was observed. Radiographs were obtained for documentation purposes.    The injectate was administered into the level noted above.   Additional Comments:  The patient tolerated the procedure well Dressing: 2 x 2 sterile gauze and Band-Aid    Post-procedure details: Patient was observed during the procedure. Post-procedure instructions were reviewed.  Patient left the clinic in stable condition.

## 2022-05-06 ENCOUNTER — Telehealth: Payer: Self-pay | Admitting: Physical Medicine and Rehabilitation

## 2022-05-06 NOTE — Telephone Encounter (Signed)
Patient wants an appt for a injection. Please advise..[phone]

## 2022-05-07 ENCOUNTER — Other Ambulatory Visit: Payer: Self-pay | Admitting: Physical Medicine and Rehabilitation

## 2022-05-07 DIAGNOSIS — M5416 Radiculopathy, lumbar region: Secondary | ICD-10-CM

## 2022-05-07 NOTE — Telephone Encounter (Signed)
Spoke with patient and she is having the same type of pain as before. She stated the last injection lasted until a couple days ago. She is having "twinges". No new injury, falls or accidents

## 2022-05-12 ENCOUNTER — Ambulatory Visit (INDEPENDENT_AMBULATORY_CARE_PROVIDER_SITE_OTHER): Payer: PPO | Admitting: Physical Medicine and Rehabilitation

## 2022-05-12 ENCOUNTER — Ambulatory Visit: Payer: Self-pay

## 2022-05-12 VITALS — BP 149/82 | HR 52

## 2022-05-12 DIAGNOSIS — M47816 Spondylosis without myelopathy or radiculopathy, lumbar region: Secondary | ICD-10-CM

## 2022-05-12 DIAGNOSIS — M5416 Radiculopathy, lumbar region: Secondary | ICD-10-CM | POA: Diagnosis not present

## 2022-05-12 DIAGNOSIS — M4316 Spondylolisthesis, lumbar region: Secondary | ICD-10-CM

## 2022-05-12 DIAGNOSIS — M48061 Spinal stenosis, lumbar region without neurogenic claudication: Secondary | ICD-10-CM

## 2022-05-12 MED ORDER — METHYLPREDNISOLONE ACETATE 80 MG/ML IJ SUSP
80.0000 mg | Freq: Once | INTRAMUSCULAR | Status: AC
  Administered 2022-05-12: 80 mg

## 2022-05-12 NOTE — Progress Notes (Signed)
Kimberly Johns - 82 y.o. female MRN [id number]  Date of birth: Sep 20, 1940  Office Visit Note: Visit Date: 05/12/2022 PCP: Maurice Small, MD Referred by: Maurice Small, MD  Subjective: Chief Complaint  Patient presents with   Lower Back - Pain   HPI:  [name] is a 82 y.o. female who comes in today for planned repeat Right L5-S1  Lumbar Interlaminar epidural steroid injection with fluoroscopic guidance.  The patient has failed conservative care including home exercise, medications, time and activity modification.  This injection will be diagnostic and hopefully therapeutic.  Please see requesting physician notes for further details and justification. Patient received more than 50% pain relief from prior injection.  She is not doing as much down the leg this time.  Symptoms otherwise seem very similar.  Consider facet joint block at L4-5 on the right if it does not seem to help.  Her case is complicated by bilateral hip replacements and prior shoulder replacement.  Referring: Benita Stabile, PA-C   ROS Otherwise per HPI.  Assessment & Plan: Visit Diagnoses:    ICD-10-CM   1. Lumbar radiculopathy  M54.16 XR C-ARM NO REPORT    Epidural Steroid injection    methylPREDNISolone acetate (DEPO-MEDROL) injection 80 mg    2. Stenosis of lateral recess of lumbar spine  M48.061     3. Spondylosis without myelopathy or radiculopathy, lumbar region  M47.816     4. Spondylolisthesis of lumbar region  M43.16       Plan: No additional findings.   Meds & Orders:  Meds ordered this encounter  Medications   methylPREDNISolone acetate (DEPO-MEDROL) injection 80 mg    Orders Placed This Encounter  Procedures   XR C-ARM NO REPORT   Epidural Steroid injection    Follow-up: Return for visit to requesting provider as needed.   Procedures: No procedures performed  Lumbar Epidural Steroid Injection - Interlaminar Approach with Fluoroscopic Guidance  Patient: Kimberly Johns      Date of Birth:  05/05/1941 MRN: [id number] PCP: Maurice Small, MD      Visit Date: 05/12/2022   Universal Protocol:     Consent Given By: the patient  Position: PRONE  Additional Comments: Vital signs were monitored before and after the procedure. Patient was prepped and draped in the usual sterile fashion. The correct patient, procedure, and site was verified.   Injection Procedure Details:   Procedure diagnoses: Lumbar radiculopathy [M54.16]   Meds Administered:  Meds ordered this encounter  Medications   methylPREDNISolone acetate (DEPO-MEDROL) injection 80 mg     Laterality: Right  Location/Site:  L5-S1  Needle: 3.5 in., 20 ga. Tuohy  Needle Placement: Paramedian epidural  Findings:   -Comments: Excellent flow of contrast into the epidural space.  Procedure Details: Using a paramedian approach from the side mentioned above, the region overlying the inferior lamina was localized under fluoroscopic visualization and the soft tissues overlying this structure were infiltrated with 4 ml. of 1% Lidocaine without Epinephrine. The Tuohy needle was inserted into the epidural space using a paramedian approach.   The epidural space was localized using loss of resistance along with counter oblique bi-planar fluoroscopic views.  After negative aspirate for air, blood, and CSF, a 2 ml. volume of Isovue-250 was injected into the epidural space and the flow of contrast was observed. Radiographs were obtained for documentation purposes.    The injectate was administered into the level noted above.   Additional Comments:  No complications occurred Dressing: 2  x 2 sterile gauze and Band-Aid    Post-procedure details: Patient was observed during the procedure. Post-procedure instructions were reviewed.  Patient left the clinic in stable condition.   Clinical History: MRI LUMBAR SPINE WITHOUT CONTRAST   TECHNIQUE: Multiplanar, multisequence MR imaging of the lumbar spine was performed. No  intravenous contrast was administered.   COMPARISON:  2015   FINDINGS: Segmentation:  Standard.   Alignment: Mild sigmoid curvature. New grade 1 anterolisthesis at L4-L5. Similar mild retrolisthesis at L2-L3 and L3-L4.   Vertebrae: Degenerative endplate irregularity. There is mild associated marrow edema at T12-L1. No suspicious osseous lesion.   Conus medullaris and cauda equina: Conus extends to the T12-L1 level. Conus and cauda equina appear normal.   Paraspinal and other soft tissues: Unremarkable.   Disc levels:   T12-L1: Disc bulge with endplate osteophytic ridging slightly eccentric to the left. No canal or foraminal stenosis.   L1-L2: Disc bulge.  No canal or foraminal stenosis.   L2-L3: Disc bulge with endplate osteophytic ridging slightly eccentric to the right. No canal or foraminal stenosis.   L3-L4: Disc bulge with endplate osteophytic ridging slightly eccentric to the left. No canal or foraminal stenosis.   L4-L5: New anterolisthesis with disc uncovering and superimposed right foraminal protrusion. New facet arthropathy with ligamentum flavum infolding. Mild to moderate canal stenosis. Narrowing of the right greater than left subarticular recesses. Mild to moderate right foraminal stenosis. No left foraminal stenosis.   L5-S1: Disc bulge with endplate osteophytic ridging eccentric to the left. No canal or right foraminal stenosis. Mild left foraminal stenosis.   IMPRESSION: Multilevel degenerative changes as detailed above. Progression since 2015 particularly at L4-L5 where there is facet mediated anterolisthesis. Narrowing of the right greater than left subarticular recesses and right foraminal narrowing at this level.     Electronically Signed   By: Guadlupe Spanish M.D.   On: 12/17/2021 09:23     Objective:  VS:  HT:    WT:   BMI:     BP:121/77  HR:(!) 56bpm  TEMP: ( )  RESP:  Physical Exam Vitals and nursing note reviewed.   Constitutional:      General: She is not in acute distress.    Appearance: Normal appearance. She is not ill-appearing.  HENT:     Head: Normocephalic and atraumatic.     Right Ear: External ear normal.     Left Ear: External ear normal.  Eyes:     Extraocular Movements: Extraocular movements intact.  Cardiovascular:     Rate and Rhythm: Normal rate.     Pulses: Normal pulses.  Pulmonary:     Effort: Pulmonary effort is normal. No respiratory distress.  Abdominal:     General: There is no distension.     Palpations: Abdomen is soft.  Musculoskeletal:        General: Tenderness present.     Cervical back: Neck supple.     Right lower leg: No edema.     Left lower leg: No edema.     Comments: Patient has good distal strength with no pain over the greater trochanters.  No clonus or focal weakness.  Skin:    Findings: No erythema, lesion or rash.  Neurological:     General: No focal deficit present.     Mental Status: She is alert and oriented to person, place, and time.     Sensory: No sensory deficit.     Motor: No weakness or abnormal muscle tone.  Coordination: Coordination normal.  Psychiatric:        Mood and Affect: Mood normal.        Behavior: Behavior normal.      Imaging: No results found.

## 2022-05-12 NOTE — Patient Instructions (Signed)

## 2022-05-12 NOTE — Procedures (Signed)
Lumbar Epidural Steroid Injection - Interlaminar Approach with Fluoroscopic Guidance  Patient: Kimberly Johns      Date of Birth: 08-23-1940 MRN: [id number] PCP: Maurice Small, MD      Visit Date: 05/12/2022   Universal Protocol:     Consent Given By: the patient  Position: PRONE  Additional Comments: Vital signs were monitored before and after the procedure. Patient was prepped and draped in the usual sterile fashion. The correct patient, procedure, and site was verified.   Injection Procedure Details:   Procedure diagnoses: Lumbar radiculopathy [M54.16]   Meds Administered:  Meds ordered this encounter  Medications   methylPREDNISolone acetate (DEPO-MEDROL) injection 80 mg     Laterality: Right  Location/Site:  L5-S1  Needle: 3.5 in., 20 ga. Tuohy  Needle Placement: Paramedian epidural  Findings:   -Comments: Excellent flow of contrast into the epidural space.  Procedure Details: Using a paramedian approach from the side mentioned above, the region overlying the inferior lamina was localized under fluoroscopic visualization and the soft tissues overlying this structure were infiltrated with 4 ml. of 1% Lidocaine without Epinephrine. The Tuohy needle was inserted into the epidural space using a paramedian approach.   The epidural space was localized using loss of resistance along with counter oblique bi-planar fluoroscopic views.  After negative aspirate for air, blood, and CSF, a 2 ml. volume of Isovue-250 was injected into the epidural space and the flow of contrast was observed. Radiographs were obtained for documentation purposes.    The injectate was administered into the level noted above.   Additional Comments:  No complications occurred Dressing: 2 x 2 sterile gauze and Band-Aid    Post-procedure details: Patient was observed during the procedure. Post-procedure instructions were reviewed.  Patient left the clinic in stable condition.

## 2022-05-12 NOTE — Progress Notes (Signed)
Functional Pain Scale - descriptive words and definitions  Uncomfortable (3)  Pain is present but can complete all ADL's/sleep is slightly affected and passive distraction only gives marginal relief. Mild range order  Average Pain 3   +Driver, -BT, -Dye Allergies.  Right lower back pain with no radiation. Sitting makes pain worse

## 2022-05-21 ENCOUNTER — Ambulatory Visit
Admission: RE | Admit: 2022-05-21 | Discharge: 2022-05-21 | Disposition: A | Discharge status: Home / Self Care | Payer: PPO | Source: Ambulatory Visit | Attending: Family Medicine | Admitting: Family Medicine

## 2022-05-21 DIAGNOSIS — M85831 Other specified disorders of bone density and structure, right forearm: Secondary | ICD-10-CM | POA: Diagnosis not present

## 2022-05-21 DIAGNOSIS — E2839 Other primary ovarian failure: Secondary | ICD-10-CM

## 2022-05-21 DIAGNOSIS — Z780 Asymptomatic menopausal state: Secondary | ICD-10-CM | POA: Diagnosis not present

## 2022-06-19 DIAGNOSIS — R350 Frequency of micturition: Secondary | ICD-10-CM | POA: Diagnosis not present

## 2022-06-19 DIAGNOSIS — N390 Urinary tract infection, site not specified: Secondary | ICD-10-CM | POA: Diagnosis not present

## 2022-07-10 ENCOUNTER — Encounter: Payer: Self-pay | Admitting: Radiology

## 2022-07-10 DIAGNOSIS — H524 Presbyopia: Secondary | ICD-10-CM | POA: Diagnosis not present

## 2022-07-10 DIAGNOSIS — H25813 Combined forms of age-related cataract, bilateral: Secondary | ICD-10-CM | POA: Diagnosis not present

## 2022-07-10 DIAGNOSIS — H02831 Dermatochalasis of right upper eyelid: Secondary | ICD-10-CM | POA: Diagnosis not present

## 2022-07-10 DIAGNOSIS — H04123 Dry eye syndrome of bilateral lacrimal glands: Secondary | ICD-10-CM | POA: Diagnosis not present

## 2022-07-10 DIAGNOSIS — H02834 Dermatochalasis of left upper eyelid: Secondary | ICD-10-CM | POA: Diagnosis not present

## 2022-08-20 DIAGNOSIS — Z96611 Presence of right artificial shoulder joint: Secondary | ICD-10-CM | POA: Diagnosis not present

## 2022-08-27 DIAGNOSIS — E039 Hypothyroidism, unspecified: Secondary | ICD-10-CM | POA: Diagnosis not present

## 2022-08-27 DIAGNOSIS — E785 Hyperlipidemia, unspecified: Secondary | ICD-10-CM | POA: Diagnosis not present

## 2022-10-13 DIAGNOSIS — Z124 Encounter for screening for malignant neoplasm of cervix: Secondary | ICD-10-CM | POA: Diagnosis not present

## 2022-10-13 DIAGNOSIS — Z6824 Body mass index (BMI) 24.0-24.9, adult: Secondary | ICD-10-CM | POA: Diagnosis not present

## 2022-10-13 DIAGNOSIS — Z1231 Encounter for screening mammogram for malignant neoplasm of breast: Secondary | ICD-10-CM | POA: Diagnosis not present

## 2022-10-13 DIAGNOSIS — Z01419 Encounter for gynecological examination (general) (routine) without abnormal findings: Secondary | ICD-10-CM | POA: Diagnosis not present

## 2022-12-18 ENCOUNTER — Encounter: Payer: Self-pay | Admitting: Orthopaedic Surgery

## 2023-01-21 DIAGNOSIS — I700 Atherosclerosis of aorta: Secondary | ICD-10-CM | POA: Diagnosis not present

## 2023-01-21 DIAGNOSIS — I10 Essential (primary) hypertension: Secondary | ICD-10-CM | POA: Diagnosis not present

## 2023-01-21 DIAGNOSIS — F419 Anxiety disorder, unspecified: Secondary | ICD-10-CM | POA: Diagnosis not present

## 2023-01-21 DIAGNOSIS — E039 Hypothyroidism, unspecified: Secondary | ICD-10-CM | POA: Diagnosis not present

## 2023-01-21 DIAGNOSIS — E785 Hyperlipidemia, unspecified: Secondary | ICD-10-CM | POA: Diagnosis not present

## 2023-01-21 DIAGNOSIS — E559 Vitamin D deficiency, unspecified: Secondary | ICD-10-CM | POA: Diagnosis not present

## 2023-01-21 DIAGNOSIS — Z0000 Encounter for general adult medical examination without abnormal findings: Secondary | ICD-10-CM | POA: Diagnosis not present

## 2023-01-21 DIAGNOSIS — M8580 Other specified disorders of bone density and structure, unspecified site: Secondary | ICD-10-CM | POA: Diagnosis not present

## 2023-01-21 DIAGNOSIS — Z52008 Unspecified donor, other blood: Secondary | ICD-10-CM | POA: Diagnosis not present

## 2023-01-21 DIAGNOSIS — N1831 Chronic kidney disease, stage 3a: Secondary | ICD-10-CM | POA: Diagnosis not present

## 2023-01-21 DIAGNOSIS — M1990 Unspecified osteoarthritis, unspecified site: Secondary | ICD-10-CM | POA: Diagnosis not present

## 2023-02-20 DIAGNOSIS — E039 Hypothyroidism, unspecified: Secondary | ICD-10-CM | POA: Diagnosis not present

## 2023-02-20 DIAGNOSIS — N1831 Chronic kidney disease, stage 3a: Secondary | ICD-10-CM | POA: Diagnosis not present

## 2023-03-23 DIAGNOSIS — E039 Hypothyroidism, unspecified: Secondary | ICD-10-CM | POA: Diagnosis not present

## 2023-06-26 DIAGNOSIS — R350 Frequency of micturition: Secondary | ICD-10-CM | POA: Diagnosis not present

## 2023-07-13 DIAGNOSIS — H04123 Dry eye syndrome of bilateral lacrimal glands: Secondary | ICD-10-CM | POA: Diagnosis not present

## 2023-07-13 DIAGNOSIS — H25813 Combined forms of age-related cataract, bilateral: Secondary | ICD-10-CM | POA: Diagnosis not present

## 2023-07-13 DIAGNOSIS — H524 Presbyopia: Secondary | ICD-10-CM | POA: Diagnosis not present

## 2023-09-21 DIAGNOSIS — M1990 Unspecified osteoarthritis, unspecified site: Secondary | ICD-10-CM | POA: Diagnosis not present

## 2023-09-21 DIAGNOSIS — D649 Anemia, unspecified: Secondary | ICD-10-CM | POA: Diagnosis not present

## 2023-09-21 DIAGNOSIS — E039 Hypothyroidism, unspecified: Secondary | ICD-10-CM | POA: Diagnosis not present

## 2023-09-21 DIAGNOSIS — N1831 Chronic kidney disease, stage 3a: Secondary | ICD-10-CM | POA: Diagnosis not present

## 2023-09-21 DIAGNOSIS — I10 Essential (primary) hypertension: Secondary | ICD-10-CM | POA: Diagnosis not present

## 2023-09-21 DIAGNOSIS — E559 Vitamin D deficiency, unspecified: Secondary | ICD-10-CM | POA: Diagnosis not present

## 2023-10-19 DIAGNOSIS — Z1231 Encounter for screening mammogram for malignant neoplasm of breast: Secondary | ICD-10-CM | POA: Diagnosis not present

## 2023-10-28 DIAGNOSIS — N1831 Chronic kidney disease, stage 3a: Secondary | ICD-10-CM | POA: Diagnosis not present

## 2023-10-28 DIAGNOSIS — I10 Essential (primary) hypertension: Secondary | ICD-10-CM | POA: Diagnosis not present

## 2023-10-29 DIAGNOSIS — L570 Actinic keratosis: Secondary | ICD-10-CM | POA: Diagnosis not present

## 2023-10-29 DIAGNOSIS — L820 Inflamed seborrheic keratosis: Secondary | ICD-10-CM | POA: Diagnosis not present

## 2023-10-29 DIAGNOSIS — D485 Neoplasm of uncertain behavior of skin: Secondary | ICD-10-CM | POA: Diagnosis not present

## 2023-10-29 DIAGNOSIS — L578 Other skin changes due to chronic exposure to nonionizing radiation: Secondary | ICD-10-CM | POA: Diagnosis not present

## 2023-10-29 DIAGNOSIS — I788 Other diseases of capillaries: Secondary | ICD-10-CM | POA: Diagnosis not present

## 2023-11-02 DIAGNOSIS — E039 Hypothyroidism, unspecified: Secondary | ICD-10-CM | POA: Diagnosis not present

## 2023-11-02 DIAGNOSIS — M1990 Unspecified osteoarthritis, unspecified site: Secondary | ICD-10-CM | POA: Diagnosis not present

## 2023-11-02 DIAGNOSIS — N1831 Chronic kidney disease, stage 3a: Secondary | ICD-10-CM | POA: Diagnosis not present

## 2023-11-02 DIAGNOSIS — I10 Essential (primary) hypertension: Secondary | ICD-10-CM | POA: Diagnosis not present

## 2023-11-10 DIAGNOSIS — L820 Inflamed seborrheic keratosis: Secondary | ICD-10-CM | POA: Diagnosis not present

## 2023-11-10 DIAGNOSIS — L570 Actinic keratosis: Secondary | ICD-10-CM | POA: Diagnosis not present

## 2023-11-24 DIAGNOSIS — E039 Hypothyroidism, unspecified: Secondary | ICD-10-CM | POA: Diagnosis not present

## 2023-11-26 DIAGNOSIS — N1831 Chronic kidney disease, stage 3a: Secondary | ICD-10-CM | POA: Diagnosis not present

## 2023-11-26 DIAGNOSIS — I10 Essential (primary) hypertension: Secondary | ICD-10-CM | POA: Diagnosis not present

## 2023-12-03 DIAGNOSIS — I10 Essential (primary) hypertension: Secondary | ICD-10-CM | POA: Diagnosis not present

## 2023-12-03 DIAGNOSIS — E039 Hypothyroidism, unspecified: Secondary | ICD-10-CM | POA: Diagnosis not present

## 2023-12-03 DIAGNOSIS — N1831 Chronic kidney disease, stage 3a: Secondary | ICD-10-CM | POA: Diagnosis not present

## 2023-12-03 DIAGNOSIS — M1990 Unspecified osteoarthritis, unspecified site: Secondary | ICD-10-CM | POA: Diagnosis not present

## 2023-12-26 DIAGNOSIS — I10 Essential (primary) hypertension: Secondary | ICD-10-CM | POA: Diagnosis not present

## 2023-12-26 DIAGNOSIS — N1831 Chronic kidney disease, stage 3a: Secondary | ICD-10-CM | POA: Diagnosis not present

## 2024-01-03 DIAGNOSIS — I10 Essential (primary) hypertension: Secondary | ICD-10-CM | POA: Diagnosis not present

## 2024-01-03 DIAGNOSIS — E039 Hypothyroidism, unspecified: Secondary | ICD-10-CM | POA: Diagnosis not present

## 2024-01-03 DIAGNOSIS — M1990 Unspecified osteoarthritis, unspecified site: Secondary | ICD-10-CM | POA: Diagnosis not present

## 2024-01-03 DIAGNOSIS — N1831 Chronic kidney disease, stage 3a: Secondary | ICD-10-CM | POA: Diagnosis not present

## 2024-01-25 DIAGNOSIS — N1831 Chronic kidney disease, stage 3a: Secondary | ICD-10-CM | POA: Diagnosis not present

## 2024-01-25 DIAGNOSIS — I10 Essential (primary) hypertension: Secondary | ICD-10-CM | POA: Diagnosis not present

## 2024-01-28 DIAGNOSIS — D649 Anemia, unspecified: Secondary | ICD-10-CM | POA: Diagnosis not present

## 2024-01-28 DIAGNOSIS — Z0000 Encounter for general adult medical examination without abnormal findings: Secondary | ICD-10-CM | POA: Diagnosis not present

## 2024-01-28 DIAGNOSIS — N1831 Chronic kidney disease, stage 3a: Secondary | ICD-10-CM | POA: Diagnosis not present

## 2024-01-28 DIAGNOSIS — M1990 Unspecified osteoarthritis, unspecified site: Secondary | ICD-10-CM | POA: Diagnosis not present

## 2024-01-28 DIAGNOSIS — E559 Vitamin D deficiency, unspecified: Secondary | ICD-10-CM | POA: Diagnosis not present

## 2024-01-28 DIAGNOSIS — E785 Hyperlipidemia, unspecified: Secondary | ICD-10-CM | POA: Diagnosis not present

## 2024-01-28 DIAGNOSIS — Z1331 Encounter for screening for depression: Secondary | ICD-10-CM | POA: Diagnosis not present

## 2024-01-28 DIAGNOSIS — I10 Essential (primary) hypertension: Secondary | ICD-10-CM | POA: Diagnosis not present

## 2024-01-28 DIAGNOSIS — F419 Anxiety disorder, unspecified: Secondary | ICD-10-CM | POA: Diagnosis not present

## 2024-01-28 DIAGNOSIS — E039 Hypothyroidism, unspecified: Secondary | ICD-10-CM | POA: Diagnosis not present

## 2024-01-28 DIAGNOSIS — Z52008 Unspecified donor, other blood: Secondary | ICD-10-CM | POA: Diagnosis not present

## 2024-01-28 DIAGNOSIS — I700 Atherosclerosis of aorta: Secondary | ICD-10-CM | POA: Diagnosis not present

## 2024-01-28 DIAGNOSIS — M8580 Other specified disorders of bone density and structure, unspecified site: Secondary | ICD-10-CM | POA: Diagnosis not present

## 2024-02-02 DIAGNOSIS — I10 Essential (primary) hypertension: Secondary | ICD-10-CM | POA: Diagnosis not present

## 2024-02-02 DIAGNOSIS — N1831 Chronic kidney disease, stage 3a: Secondary | ICD-10-CM | POA: Diagnosis not present

## 2024-03-04 DIAGNOSIS — I10 Essential (primary) hypertension: Secondary | ICD-10-CM | POA: Diagnosis not present

## 2024-03-04 DIAGNOSIS — N1831 Chronic kidney disease, stage 3a: Secondary | ICD-10-CM | POA: Diagnosis not present

## 2024-03-04 DIAGNOSIS — E039 Hypothyroidism, unspecified: Secondary | ICD-10-CM | POA: Diagnosis not present

## 2024-03-04 DIAGNOSIS — M1990 Unspecified osteoarthritis, unspecified site: Secondary | ICD-10-CM | POA: Diagnosis not present

## 2024-03-15 DIAGNOSIS — I10 Essential (primary) hypertension: Secondary | ICD-10-CM | POA: Diagnosis not present

## 2024-03-15 DIAGNOSIS — N1831 Chronic kidney disease, stage 3a: Secondary | ICD-10-CM | POA: Diagnosis not present

## 2024-03-27 DIAGNOSIS — I10 Essential (primary) hypertension: Secondary | ICD-10-CM | POA: Diagnosis not present

## 2024-03-27 DIAGNOSIS — N1831 Chronic kidney disease, stage 3a: Secondary | ICD-10-CM | POA: Diagnosis not present

## 2024-04-03 DIAGNOSIS — M1990 Unspecified osteoarthritis, unspecified site: Secondary | ICD-10-CM | POA: Diagnosis not present

## 2024-04-03 DIAGNOSIS — E039 Hypothyroidism, unspecified: Secondary | ICD-10-CM | POA: Diagnosis not present

## 2024-04-03 DIAGNOSIS — I10 Essential (primary) hypertension: Secondary | ICD-10-CM | POA: Diagnosis not present

## 2024-04-03 DIAGNOSIS — N1831 Chronic kidney disease, stage 3a: Secondary | ICD-10-CM | POA: Diagnosis not present
